# Patient Record
Sex: Male | Born: 1935 | Race: White | Hispanic: No | Marital: Married | Smoking: Former smoker
Health system: Southern US, Community
[De-identification: ages and names within clinical notes are randomized; demographics above are authoritative.]

## PROBLEM LIST (undated history)

## (undated) DIAGNOSIS — F32A Depression, unspecified: Secondary | ICD-10-CM

## (undated) DIAGNOSIS — F329 Major depressive disorder, single episode, unspecified: Secondary | ICD-10-CM

## (undated) DIAGNOSIS — I10 Essential (primary) hypertension: Secondary | ICD-10-CM

## (undated) DIAGNOSIS — J449 Chronic obstructive pulmonary disease, unspecified: Secondary | ICD-10-CM

## (undated) DIAGNOSIS — M1990 Unspecified osteoarthritis, unspecified site: Secondary | ICD-10-CM

## (undated) DIAGNOSIS — Z8679 Personal history of other diseases of the circulatory system: Secondary | ICD-10-CM

## (undated) DIAGNOSIS — Z8739 Personal history of other diseases of the musculoskeletal system and connective tissue: Secondary | ICD-10-CM

## (undated) DIAGNOSIS — G8929 Other chronic pain: Secondary | ICD-10-CM

## (undated) DIAGNOSIS — N400 Enlarged prostate without lower urinary tract symptoms: Secondary | ICD-10-CM

## (undated) DIAGNOSIS — M549 Dorsalgia, unspecified: Secondary | ICD-10-CM

## (undated) DIAGNOSIS — H3530 Unspecified macular degeneration: Secondary | ICD-10-CM

## (undated) DIAGNOSIS — Z87898 Personal history of other specified conditions: Secondary | ICD-10-CM

---

## 2011-06-03 DIAGNOSIS — M549 Dorsalgia, unspecified: Secondary | ICD-10-CM | POA: Insufficient documentation

## 2012-09-15 DIAGNOSIS — Z9109 Other allergy status, other than to drugs and biological substances: Secondary | ICD-10-CM | POA: Insufficient documentation

## 2012-11-05 DIAGNOSIS — H353290 Exudative age-related macular degeneration, unspecified eye, stage unspecified: Secondary | ICD-10-CM | POA: Insufficient documentation

## 2012-11-05 DIAGNOSIS — H35059 Retinal neovascularization, unspecified, unspecified eye: Secondary | ICD-10-CM | POA: Insufficient documentation

## 2012-11-20 DIAGNOSIS — M47812 Spondylosis without myelopathy or radiculopathy, cervical region: Secondary | ICD-10-CM | POA: Insufficient documentation

## 2012-11-20 DIAGNOSIS — R269 Unspecified abnormalities of gait and mobility: Secondary | ICD-10-CM

## 2012-11-20 DIAGNOSIS — J309 Allergic rhinitis, unspecified: Secondary | ICD-10-CM | POA: Insufficient documentation

## 2012-11-20 DIAGNOSIS — F329 Major depressive disorder, single episode, unspecified: Secondary | ICD-10-CM | POA: Insufficient documentation

## 2012-11-20 DIAGNOSIS — J438 Other emphysema: Secondary | ICD-10-CM | POA: Insufficient documentation

## 2012-11-20 DIAGNOSIS — N400 Enlarged prostate without lower urinary tract symptoms: Secondary | ICD-10-CM | POA: Insufficient documentation

## 2012-11-20 DIAGNOSIS — R079 Chest pain, unspecified: Secondary | ICD-10-CM | POA: Insufficient documentation

## 2012-11-20 DIAGNOSIS — R109 Unspecified abdominal pain: Secondary | ICD-10-CM | POA: Insufficient documentation

## 2012-11-22 DIAGNOSIS — N62 Hypertrophy of breast: Secondary | ICD-10-CM | POA: Insufficient documentation

## 2012-11-22 DIAGNOSIS — M109 Gout, unspecified: Secondary | ICD-10-CM | POA: Diagnosis present

## 2012-11-22 DIAGNOSIS — E663 Overweight: Secondary | ICD-10-CM | POA: Insufficient documentation

## 2013-05-10 DIAGNOSIS — M47817 Spondylosis without myelopathy or radiculopathy, lumbosacral region: Secondary | ICD-10-CM | POA: Insufficient documentation

## 2013-05-10 DIAGNOSIS — I10 Essential (primary) hypertension: Secondary | ICD-10-CM | POA: Diagnosis present

## 2013-05-10 DIAGNOSIS — H6120 Impacted cerumen, unspecified ear: Secondary | ICD-10-CM | POA: Insufficient documentation

## 2013-05-10 DIAGNOSIS — H9190 Unspecified hearing loss, unspecified ear: Secondary | ICD-10-CM | POA: Insufficient documentation

## 2013-05-10 DIAGNOSIS — H3530 Unspecified macular degeneration: Secondary | ICD-10-CM | POA: Insufficient documentation

## 2013-10-12 DIAGNOSIS — G609 Hereditary and idiopathic neuropathy, unspecified: Secondary | ICD-10-CM | POA: Insufficient documentation

## 2013-10-12 DIAGNOSIS — M7550 Bursitis of unspecified shoulder: Secondary | ICD-10-CM | POA: Insufficient documentation

## 2014-05-04 DIAGNOSIS — I619 Nontraumatic intracerebral hemorrhage, unspecified: Secondary | ICD-10-CM | POA: Insufficient documentation

## 2014-05-05 DIAGNOSIS — I491 Atrial premature depolarization: Secondary | ICD-10-CM | POA: Insufficient documentation

## 2014-05-05 DIAGNOSIS — R55 Syncope and collapse: Secondary | ICD-10-CM | POA: Insufficient documentation

## 2014-06-08 DIAGNOSIS — M25511 Pain in right shoulder: Secondary | ICD-10-CM | POA: Insufficient documentation

## 2018-02-27 ENCOUNTER — Emergency Department (HOSPITAL_COMMUNITY): Payer: Medicare Other

## 2018-02-27 ENCOUNTER — Inpatient Hospital Stay (HOSPITAL_COMMUNITY)
Admission: EM | Admit: 2018-02-27 | Discharge: 2018-03-05 | DRG: 055 | Disposition: A | Discharge status: Skilled Nursing Facility | Payer: Medicare Other | Attending: Internal Medicine | Admitting: Internal Medicine

## 2018-02-27 ENCOUNTER — Encounter (HOSPITAL_COMMUNITY): Payer: Self-pay | Admitting: Nurse Practitioner

## 2018-02-27 DIAGNOSIS — W19XXXA Unspecified fall, initial encounter: Secondary | ICD-10-CM | POA: Diagnosis not present

## 2018-02-27 DIAGNOSIS — Z7951 Long term (current) use of inhaled steroids: Secondary | ICD-10-CM | POA: Diagnosis not present

## 2018-02-27 DIAGNOSIS — H353290 Exudative age-related macular degeneration, unspecified eye, stage unspecified: Secondary | ICD-10-CM | POA: Diagnosis present

## 2018-02-27 DIAGNOSIS — M109 Gout, unspecified: Secondary | ICD-10-CM | POA: Diagnosis not present

## 2018-02-27 DIAGNOSIS — R011 Cardiac murmur, unspecified: Secondary | ICD-10-CM | POA: Diagnosis present

## 2018-02-27 DIAGNOSIS — Z515 Encounter for palliative care: Secondary | ICD-10-CM | POA: Diagnosis not present

## 2018-02-27 DIAGNOSIS — R269 Unspecified abnormalities of gait and mobility: Secondary | ICD-10-CM

## 2018-02-27 DIAGNOSIS — R911 Solitary pulmonary nodule: Secondary | ICD-10-CM | POA: Diagnosis not present

## 2018-02-27 DIAGNOSIS — R58 Hemorrhage, not elsewhere classified: Secondary | ICD-10-CM | POA: Diagnosis present

## 2018-02-27 DIAGNOSIS — Z79899 Other long term (current) drug therapy: Secondary | ICD-10-CM | POA: Diagnosis not present

## 2018-02-27 DIAGNOSIS — N390 Urinary tract infection, site not specified: Secondary | ICD-10-CM

## 2018-02-27 DIAGNOSIS — C801 Malignant (primary) neoplasm, unspecified: Secondary | ICD-10-CM

## 2018-02-27 DIAGNOSIS — I491 Atrial premature depolarization: Secondary | ICD-10-CM | POA: Diagnosis present

## 2018-02-27 DIAGNOSIS — Z882 Allergy status to sulfonamides status: Secondary | ICD-10-CM | POA: Diagnosis not present

## 2018-02-27 DIAGNOSIS — L989 Disorder of the skin and subcutaneous tissue, unspecified: Secondary | ICD-10-CM | POA: Diagnosis present

## 2018-02-27 DIAGNOSIS — G9389 Other specified disorders of brain: Secondary | ICD-10-CM

## 2018-02-27 DIAGNOSIS — H9113 Presbycusis, bilateral: Secondary | ICD-10-CM | POA: Diagnosis present

## 2018-02-27 DIAGNOSIS — Y92009 Unspecified place in unspecified non-institutional (private) residence as the place of occurrence of the external cause: Secondary | ICD-10-CM | POA: Diagnosis not present

## 2018-02-27 DIAGNOSIS — Z888 Allergy status to other drugs, medicaments and biological substances status: Secondary | ICD-10-CM

## 2018-02-27 DIAGNOSIS — Z66 Do not resuscitate: Secondary | ICD-10-CM | POA: Diagnosis present

## 2018-02-27 DIAGNOSIS — F418 Other specified anxiety disorders: Secondary | ICD-10-CM | POA: Diagnosis present

## 2018-02-27 DIAGNOSIS — C7931 Secondary malignant neoplasm of brain: Principal | ICD-10-CM | POA: Diagnosis present

## 2018-02-27 DIAGNOSIS — I10 Essential (primary) hypertension: Secondary | ICD-10-CM | POA: Diagnosis present

## 2018-02-27 DIAGNOSIS — R296 Repeated falls: Secondary | ICD-10-CM | POA: Diagnosis present

## 2018-02-27 DIAGNOSIS — R260 Ataxic gait: Secondary | ICD-10-CM | POA: Diagnosis present

## 2018-02-27 DIAGNOSIS — Z87891 Personal history of nicotine dependence: Secondary | ICD-10-CM | POA: Diagnosis not present

## 2018-02-27 DIAGNOSIS — C787 Secondary malignant neoplasm of liver and intrahepatic bile duct: Secondary | ICD-10-CM | POA: Diagnosis present

## 2018-02-27 DIAGNOSIS — C384 Malignant neoplasm of pleura: Secondary | ICD-10-CM | POA: Diagnosis present

## 2018-02-27 DIAGNOSIS — R531 Weakness: Secondary | ICD-10-CM | POA: Diagnosis not present

## 2018-02-27 DIAGNOSIS — J449 Chronic obstructive pulmonary disease, unspecified: Secondary | ICD-10-CM | POA: Diagnosis present

## 2018-02-27 DIAGNOSIS — G939 Disorder of brain, unspecified: Secondary | ICD-10-CM | POA: Diagnosis not present

## 2018-02-27 DIAGNOSIS — R270 Ataxia, unspecified: Secondary | ICD-10-CM | POA: Diagnosis not present

## 2018-02-27 DIAGNOSIS — K219 Gastro-esophageal reflux disease without esophagitis: Secondary | ICD-10-CM | POA: Diagnosis present

## 2018-02-27 DIAGNOSIS — R918 Other nonspecific abnormal finding of lung field: Secondary | ICD-10-CM | POA: Diagnosis not present

## 2018-02-27 DIAGNOSIS — Z9181 History of falling: Secondary | ICD-10-CM | POA: Diagnosis not present

## 2018-02-27 DIAGNOSIS — Z8679 Personal history of other diseases of the circulatory system: Secondary | ICD-10-CM

## 2018-02-27 HISTORY — DX: Other chronic pain: G89.29

## 2018-02-27 HISTORY — DX: Personal history of other diseases of the musculoskeletal system and connective tissue: Z87.39

## 2018-02-27 HISTORY — DX: Dorsalgia, unspecified: M54.9

## 2018-02-27 HISTORY — DX: Chronic obstructive pulmonary disease, unspecified: J44.9

## 2018-02-27 HISTORY — DX: Personal history of other diseases of the circulatory system: Z86.79

## 2018-02-27 HISTORY — DX: Unspecified macular degeneration: H35.30

## 2018-02-27 HISTORY — DX: Unspecified osteoarthritis, unspecified site: M19.90

## 2018-02-27 HISTORY — DX: Depression, unspecified: F32.A

## 2018-02-27 HISTORY — DX: Major depressive disorder, single episode, unspecified: F32.9

## 2018-02-27 HISTORY — DX: Essential (primary) hypertension: I10

## 2018-02-27 HISTORY — DX: Personal history of other specified conditions: Z87.898

## 2018-02-27 HISTORY — DX: Benign prostatic hyperplasia without lower urinary tract symptoms: N40.0

## 2018-02-27 LAB — COMPREHENSIVE METABOLIC PANEL
ALBUMIN: 3.8 g/dL (ref 3.5–5.0)
ALK PHOS: 61 U/L (ref 38–126)
ALT: 15 U/L (ref 0–44)
AST: 23 U/L (ref 15–41)
Anion gap: 12 (ref 5–15)
BILIRUBIN TOTAL: 0.9 mg/dL (ref 0.3–1.2)
BUN: 22 mg/dL (ref 8–23)
CALCIUM: 9.2 mg/dL (ref 8.9–10.3)
CO2: 28 mmol/L (ref 22–32)
Chloride: 101 mmol/L (ref 98–111)
Creatinine, Ser: 1.12 mg/dL (ref 0.61–1.24)
GFR calc Af Amer: >60 mL/min (ref >60)
GFR calc non Af Amer: 55 mL/min — ABNORMAL LOW (ref >60)
GLUCOSE: 113 mg/dL — AB (ref 70–99)
POTASSIUM: 3.9 mmol/L (ref 3.5–5.1)
Sodium: 141 mmol/L (ref 135–145)
TOTAL PROTEIN: 7 g/dL (ref 6.5–8.1)

## 2018-02-27 LAB — URINALYSIS, ROUTINE W REFLEX MICROSCOPIC
Bilirubin Urine: NEGATIVE
Glucose, UA: NEGATIVE mg/dL
Hgb urine dipstick: NEGATIVE
Ketones, ur: NEGATIVE mg/dL
Nitrite: NEGATIVE
PH: 6 (ref 5.0–8.0)
Protein, ur: NEGATIVE mg/dL
SPECIFIC GRAVITY, URINE: 1.014 (ref 1.005–1.030)

## 2018-02-27 LAB — CBC
HEMATOCRIT: 41.9 % (ref 39.0–52.0)
Hemoglobin: 13.8 g/dL (ref 13.0–17.0)
MCH: 30.9 pg (ref 26.0–34.0)
MCHC: 32.9 g/dL (ref 30.0–36.0)
MCV: 93.9 fL (ref 78.0–100.0)
Platelets: 238 10*3/uL (ref 150–400)
RBC: 4.46 MIL/uL (ref 4.22–5.81)
RDW: 13.1 % (ref 11.5–15.5)
WBC: 6.9 10*3/uL (ref 4.0–10.5)

## 2018-02-27 LAB — I-STAT CG4 LACTIC ACID, ED: Lactic Acid, Venous: 1.63 mmol/L (ref 0.5–1.9)

## 2018-02-27 MED ORDER — SODIUM CHLORIDE 0.9 % IV SOLN
1.0000 g | Freq: Once | INTRAVENOUS | Status: AC
  Administered 2018-02-27: 1 g via INTRAVENOUS
  Filled 2018-02-27: qty 10

## 2018-02-27 MED ORDER — SERTRALINE HCL 50 MG PO TABS
50.0000 mg | ORAL_TABLET | Freq: Every day | ORAL | Status: DC
  Administered 2018-02-28 – 2018-03-05 (×5): 50 mg via ORAL
  Filled 2018-02-27 (×5): qty 1

## 2018-02-27 MED ORDER — ACETAMINOPHEN 325 MG PO TABS
650.0000 mg | ORAL_TABLET | Freq: Once | ORAL | Status: DC
  Filled 2018-02-27: qty 2

## 2018-02-27 MED ORDER — SODIUM CHLORIDE 0.9 % IV SOLN: 250.0000 mL | INTRAVENOUS | Status: DC | PRN

## 2018-02-27 MED ORDER — PANTOPRAZOLE SODIUM 40 MG PO TBEC
40.0000 mg | DELAYED_RELEASE_TABLET | Freq: Every day | ORAL | Status: DC
  Administered 2018-02-28 – 2018-03-05 (×5): 40 mg via ORAL
  Filled 2018-02-27 (×4): qty 1

## 2018-02-27 MED ORDER — DEXAMETHASONE SODIUM PHOSPHATE 10 MG/ML IJ SOLN
10.0000 mg | Freq: Once | INTRAMUSCULAR | Status: AC
  Administered 2018-02-27: 10 mg via INTRAVENOUS
  Filled 2018-02-27: qty 1

## 2018-02-27 MED ORDER — DEXAMETHASONE SODIUM PHOSPHATE 10 MG/ML IJ SOLN
8.0000 mg | Freq: Two times a day (BID) | INTRAMUSCULAR | Status: DC
  Administered 2018-02-28: 0.8 mg via INTRAVENOUS
  Administered 2018-02-28 – 2018-03-05 (×10): 8 mg via INTRAVENOUS
  Filled 2018-02-27 (×11): qty 1

## 2018-02-27 MED ORDER — SODIUM CHLORIDE 0.9% FLUSH: 3.0000 mL | INTRAVENOUS | Status: DC | PRN

## 2018-02-27 MED ORDER — FUROSEMIDE 40 MG PO TABS
40.0000 mg | ORAL_TABLET | Freq: Every day | ORAL | Status: DC
  Administered 2018-02-28 – 2018-03-05 (×5): 40 mg via ORAL
  Filled 2018-02-27 (×5): qty 1

## 2018-02-27 MED ORDER — HEPARIN SODIUM (PORCINE) 5000 UNIT/ML IJ SOLN
5000.0000 [IU] | Freq: Three times a day (TID) | INTRAMUSCULAR | Status: DC
  Administered 2018-02-27 – 2018-03-04 (×8): 5000 [IU] via SUBCUTANEOUS
  Filled 2018-02-27 (×8): qty 1

## 2018-02-27 MED ORDER — MORPHINE SULFATE (PF) 2 MG/ML IV SOLN
2.0000 mg | Freq: Once | INTRAVENOUS | Status: AC
  Administered 2018-02-27: 2 mg via INTRAVENOUS
  Filled 2018-02-27: qty 1

## 2018-02-27 MED ORDER — SODIUM CHLORIDE 0.9% FLUSH
3.0000 mL | Freq: Two times a day (BID) | INTRAVENOUS | Status: DC
  Administered 2018-02-28 – 2018-03-01 (×4): 3 mL via INTRAVENOUS

## 2018-02-27 NOTE — ED Notes (Signed)
ED TO INPATIENT HANDOFF REPORT  Name/Age/Gender Brendan Rose 84 y.o. male  Code Status   Home/SNF/Other Nursing Home  Chief Complaint Fall; syncope  Level of Care/Admitting Diagnosis ED Disposition    ED Disposition Condition [place]: [facility] [[id number]]  Level of Care: Med-Surg [16]  Diagnosis: Brain mass [[id number]]  Admitting Physician: Eston Esters  Attending Physician: Gwynne Edinger [[id number]]  Estimated length of stay: past midnight tomorrow  Certification:: I certify this patient will need inpatient services for at least 2 midnights  PT Class (Do Not Modify): Inpatient [101]  PT Acc Code (Do Not Modify): Private [1]       Medical History History reviewed. No pertinent past medical history.  Allergies Allergies  Allergen Reactions  . Allopurinol Other (See Comments)  . Sulfa Antibiotics Rash    IV Location/Drains/Wounds Patient Lines/Drains/Airways Status   Active Line/Drains/Airways    Name:   Placement date:   Placement time:   Site:   Days:   Peripheral IV 02/27/18 Left;Medial Antecubital   02/27/18    1949    Antecubital   less than 1          Labs/Imaging Results for orders placed or performed during the hospital encounter of 02/27/18 (from the past 48 hour(s))  Urinalysis, Routine w reflex microscopic     Status: Abnormal   Collection Time: 02/27/18  7:25 PM  Result Value Ref Range   Color, Urine YELLOW YELLOW   APPearance HAZY (A) CLEAR   Specific Gravity, Urine 1.014 1.005 - 1.030   pH 6.0 5.0 - 8.0   Glucose, UA NEGATIVE NEGATIVE mg/dL   Hgb urine dipstick NEGATIVE NEGATIVE   Bilirubin Urine NEGATIVE NEGATIVE   Ketones, ur NEGATIVE NEGATIVE mg/dL   Protein, ur NEGATIVE NEGATIVE mg/dL   Nitrite NEGATIVE NEGATIVE   Leukocytes, UA SMALL (A) NEGATIVE   RBC / HPF 0-5 0 - 5 RBC/hpf   WBC, UA 21-50 0 - 5 WBC/hpf   Bacteria, UA RARE (A) NONE SEEN   Mucus PRESENT     Comment:  Performed at [facility], [place] [address], [place], [place] [address]  CBC     Status: None   Collection Time: 02/27/18  7:50 PM  Result Value Ref Range   WBC 6.9 4.0 - 10.5 K/uL   RBC 4.46 4.22 - 5.81 MIL/uL   Hemoglobin 13.8 13.0 - 17.0 g/dL   HCT 41.9 39.0 - 52.0 %   MCV 93.9 78.0 - 100.0 fL   MCH 30.9 26.0 - 34.0 pg   MCHC 32.9 30.0 - 36.0 g/dL   RDW 13.1 11.5 - 15.5 %   Platelets 238 150 - 400 K/uL    Comment: Performed at [facility], [place] [address], [place], [place] [address]  Comprehensive metabolic panel     Status: Abnormal   Collection Time: 02/27/18  7:50 PM  Result Value Ref Range   Sodium 141 135 - 145 mmol/L   Potassium 3.9 3.5 - 5.1 mmol/L   Chloride 101 98 - 111 mmol/L   CO2 28 22 - 32 mmol/L   Glucose, Bld 113 (H) 70 - 99 mg/dL   BUN 22 8 - 23 mg/dL   Creatinine, Ser 1.12 0.61 - 1.24 mg/dL   Calcium 9.2 8.9 - 10.3 mg/dL   Total Protein 7.0 6.5 - 8.1 g/dL   Albumin 3.8 3.5 - 5.0 g/dL   AST 23 15 -  41 U/L   ALT 15 0 - 44 U/L   Alkaline Phosphatase 61 38 - 126 U/L   Total Bilirubin 0.9 0.3 - 1.2 mg/dL   GFR calc non Af Amer 55 (L) >60 mL/min   GFR calc Af Amer >60 >60 mL/min    Comment: (NOTE) The eGFR has been calculated using the CKD EPI equation. This calculation has not been validated in all clinical situations. eGFR's persistently <60 mL/min signify possible Chronic Kidney Disease.    Anion gap 12 5 - 15    Comment: Performed at [facility], [place] [address], [place], [place] [address]  I-Stat CG4 Lactic Acid, ED     Status: None   Collection Time: 02/27/18  8:22 PM  Result Value Ref Range   Lactic Acid, Venous 1.63 0.5 - 1.9 mmol/L   Dg Chest 2 View  Result Date: 02/27/2018 CLINICAL DATA:  Pain after fall. EXAM: CHEST - 2 VIEW COMPARISON:  Radiograph 12/25/2017, CT 12/31/2017 FINDINGS: Again seen elevation of right hemidiaphragm. Masslike opacity in the periphery of the right lower lobe is  grossly similar less well-defined currently. No pneumothorax. No new focal airspace disease, pleural effusion or pulmonary edema. Mild peripheral scarring in the right midlung. No acute osseous abnormalities are seen. Chronic change about the right acromioclavicular joint. IMPRESSION: 1. No acute or traumatic findings. 2. Unchanged elevation of right hemidiaphragm. Persistent rounded opacity in the periphery of the right lower lobe, masslike opacity on prior chest CT. Given persistence, neoplastic process is favored. Follow-up chest CT is already scheduled. Electronically Signed   By: Keith Rake M.D.   On: 02/27/2018 21:03   Dg Lumbar Spine Complete  Result Date: 02/27/2018 CLINICAL DATA:  Initial encounter for Pt is presented from [facility] for evaluation post fall suspected to be related to a near syncope episode per family report. EXAM: LUMBAR SPINE - COMPLETE 4+ VIEW COMPARISON:  None. FINDINGS: Five lumbar type vertebral bodies. Left hip arthroplasty. Sacroiliac joints are symmetric. Maintenance of vertebral body height and alignment. Spondylosis involves L3 through S1 with degenerative disc disease and facet arthropathy. Aortic atherosclerosis. IMPRESSION: Spondylosis, without acute osseous abnormality. Aortic Atherosclerosis (ICD10-I70.0). Electronically Signed   By: Abigail Miyamoto M.D.   On: 02/27/2018 21:00   Ct Head Wo Contrast  Result Date: 02/27/2018 CLINICAL DATA:  Post fall with near syncopal episode. EXAM: CT HEAD WITHOUT CONTRAST TECHNIQUE: Contiguous axial images were obtained from the base of the skull through the vertex without intravenous contrast. COMPARISON:  11/19/2016 FINDINGS: Brain: Lateral and third ventricles are within normal. There is mild impression on the fourth ventricle as there are multiple cerebellar masses with the largest measuring 4.3 cm over the left hemisphere. These masses have low-density center and likely due to metastatic disease. There is mild adjacent  vasogenic edema. No definite supratentorial masses identified. No evidence of acute hemorrhage or midline shift. No acute infarction. Mild chronic ischemic microvascular disease. Vascular: No hyperdense vessel or unexpected calcification. Skull: Normal. Negative for fracture or focal lesion. Sinuses/Orbits: No acute finding. Other: None. IMPRESSION: No acute findings. Multiple cerebellar masses with mild adjacent edema and mild mass effect on the fourth ventricle. 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 Mild chronic ischemic microvascular disease. Electronically Signed   By: Marin Olp M.D.   On: 02/27/2018 20:06    Pending Labs Unresulted Labs (From admission, onward)    Start     Ordered   02/27/18 2008  Urine Culture  Once,   STAT  02/27/18 2007   Signed and Held  CBC  (heparin)  Once,   R    Comments:  Baseline for heparin therapy IF NOT ALREADY DRAWN.  Notify MD if PLT < 100 K.    Signed and Held   Signed and Held  Creatinine, serum  (heparin)  Once,   R    Comments:  Baseline for heparin therapy IF NOT ALREADY DRAWN.    Signed and Held          Vitals/Pain Today's Vitals   02/27/18 1930 02/27/18 1945 02/27/18 2055 02/27/18 2220  BP:  (!) 163/112  (!) 128/59  Pulse:  (!) 49  (!) 57  Resp: 17 (!) 21  18  Temp:      TempSrc:      SpO2:  96%  93%  PainSc:   Asleep     Isolation Precautions No active isolations  Medications Medications  acetaminophen (TYLENOL) tablet 650 mg (650 mg Oral Not Given 02/27/18 2015)  morphine 2 MG/ML injection 2 mg (2 mg Intravenous Given 02/27/18 2015)  dexamethasone (DECADRON) injection 10 mg (10 mg Intravenous Given 02/27/18 2055)  cefTRIAXone (ROCEPHIN) 1 g in sodium chloride 0.9 % 100 mL IVPB (0 g Intravenous Stopped 02/27/18 2228)    Mobility non-ambulatory

## 2018-02-27 NOTE — ED Notes (Signed)
This RN spoke to [employer] (pt daughter) on the phone and addressed her concerns about preferring that he go to [facility]. I explained that we are able to access his charts here and are happy to take good care of him. I also explained that due to EMTALA, we are unable to transfer him over there. She verballized understanding and stated that she was really just frustrated with EMS. I told her that if she has any other questions, she is welcome to call back. No concerns at this time.

## 2018-02-27 NOTE — H&P (Addendum)
History and Physical    Brendan Rose:[id number] DOB: 25-Mar-1922 DOA: 02/27/2018  PCP: Cathlean Sauer, MD  Patient coming from: assisted living facility [facility]   Chief Complaint: fall at home  HPI: Brendan Rose is a 84 y.o. male with medical history significant for htn, gout, presbycusis, presents w/ above.  Patient reports that for about a week has been unsteady on his feet and has had several falls. Shortly before arrival tonight went to bathroom and when stood up fell, family reports brief LOC, was conscious when went to him. Unwitnessed. Denies head pain or pain elsewhere. Currently feeling his normal self. No trouble speaking or swallowing. No focal weakness/numbness. No dysuria or fevers.  ED Course: ct, labs, dexamethasone  Review of Systems: As per HPI otherwise 10 point review of systems negative.    History reviewed. No pertinent past medical history.  History reviewed. No pertinent surgical history.   reports that he drank alcohol. He reports that he has current or past drug history. His tobacco history is not on file.  Allergies  Allergen Reactions  . Allopurinol Other (See Comments)  . Sulfa Antibiotics Rash    History reviewed. No pertinent family history.  Prior to Admission medications   Medication Sig Start Date End Date Taking? Authorizing Provider  SERTRALINE HCL PO Take by mouth.    [provider]    Physical Exam: Vitals:   02/27/18 1900 02/27/18 1915 02/27/18 1930 02/27/18 1945  BP:  (!) 146/49  (!) 163/112  Pulse: (!) 35 (!) 115  (!) 49  Resp: (!) 21 17 17  (!) 21  Temp:      TempSrc:      SpO2: 98% (!) 78%  96%    Constitutional: No acute distress Head: Atraumatic Eyes: Conjunctiva clear ENM: Moist mucous membranes. Poor dentition.  Neck: Supple Respiratory: Clear to auscultation bilaterally, no wheezing/rales/rhonchi. Normal respiratory effort. No accessory muscle use. . Cardiovascular: Regular rate and  rhythm. Soft systolic murmur, no rubs/gallops. Abdomen: Non-tender, non-distended. No masses. No rebound or guarding. Positive bowel sounds. Musculoskeletal: No joint deformity upper and lower extremities. Normal ROM, no contractures. Normal muscle tone.  Skin: echmyoses and various skin lesions throughout Extremities: No peripheral edema. Palpable peripheral pulses. Neurologic: Alert, moving all 4 extremities. Hard of hearing. Per ED physician, unable to ambulate safely Psychiatric: Normal insight and judgement.    Labs on Admission: I have personally reviewed following labs and imaging studies  CBC: Recent Labs  Lab 02/27/18 1950  WBC 6.9  HGB 13.8  HCT 41.9  MCV 93.9  PLT 536   Basic Metabolic Panel: Recent Labs  Lab 02/27/18 1950  NA 141  K 3.9  CL 101  CO2 28  GLUCOSE 113*  BUN 22  CREATININE 1.12  CALCIUM 9.2   GFR: CrCl cannot be calculated (Unknown ideal weight.). Liver Function Tests: Recent Labs  Lab 02/27/18 1950  AST 23  ALT 15  ALKPHOS 61  BILITOT 0.9  PROT 7.0  ALBUMIN 3.8   No results for input(s): LIPASE, AMYLASE in the last 168 hours. No results for input(s): AMMONIA in the last 168 hours. Coagulation Profile: No results for input(s): INR, PROTIME in the last 168 hours. Cardiac Enzymes: No results for input(s): CKTOTAL, CKMB, CKMBINDEX, TROPONINI in the last 168 hours. BNP (last 3 results) No results for input(s): PROBNP in the last 8760 hours. HbA1C: No results for input(s): HGBA1C in the last 72 hours. CBG: No results for input(s): GLUCAP in the  last 168 hours. Lipid Profile: No results for input(s): CHOL, HDL, LDLCALC, TRIG, CHOLHDL, LDLDIRECT in the last 72 hours. Thyroid Function Tests: No results for input(s): TSH, T4TOTAL, FREET4, T3FREE, THYROIDAB in the last 72 hours. Anemia Panel: No results for input(s): VITAMINB12, FOLATE, FERRITIN, TIBC, IRON, RETICCTPCT in the last 72 hours. Urine analysis:    Component Value Date/Time     COLORURINE YELLOW 02/27/2018 1925   APPEARANCEUR HAZY (A) 02/27/2018 1925   LABSPEC 1.014 02/27/2018 1925   PHURINE 6.0 02/27/2018 1925   GLUCOSEU NEGATIVE 02/27/2018 1925   HGBUR NEGATIVE 02/27/2018 1925   BILIRUBINUR NEGATIVE 02/27/2018 1925   KETONESUR NEGATIVE 02/27/2018 1925   PROTEINUR NEGATIVE 02/27/2018 1925   NITRITE NEGATIVE 02/27/2018 1925   LEUKOCYTESUR SMALL (A) 02/27/2018 1925    Radiological Exams on Admission: Dg Chest 2 View  Result Date: 02/27/2018 CLINICAL DATA:  Pain after fall. EXAM: CHEST - 2 VIEW COMPARISON:  Radiograph 12/25/2017, CT 12/31/2017 FINDINGS: Again seen elevation of right hemidiaphragm. Masslike opacity in the periphery of the right lower lobe is grossly similar less well-defined currently. No pneumothorax. No new focal airspace disease, pleural effusion or pulmonary edema. Mild peripheral scarring in the right midlung. No acute osseous abnormalities are seen. Chronic change about the right acromioclavicular joint. IMPRESSION: 1. No acute or traumatic findings. 2. Unchanged elevation of right hemidiaphragm. Persistent rounded opacity in the periphery of the right lower lobe, masslike opacity on prior chest CT. Given persistence, neoplastic process is favored. Follow-up chest CT is already scheduled. Electronically Signed   By: Keith Rake M.D.   On: 02/27/2018 21:03   Dg Lumbar Spine Complete  Result Date: 02/27/2018 CLINICAL DATA:  Initial encounter for Pt is presented from [facility] for evaluation post fall suspected to be related to a near syncope episode per family report. EXAM: LUMBAR SPINE - COMPLETE 4+ VIEW COMPARISON:  None. FINDINGS: Five lumbar type vertebral bodies. Left hip arthroplasty. Sacroiliac joints are symmetric. Maintenance of vertebral body height and alignment. Spondylosis involves L3 through S1 with degenerative disc disease and facet arthropathy. Aortic atherosclerosis. IMPRESSION: Spondylosis, without acute osseous  abnormality. Aortic Atherosclerosis (ICD10-I70.0). Electronically Signed   By: Abigail Miyamoto M.D.   On: 02/27/2018 21:00   Ct Head Wo Contrast  Result Date: 02/27/2018 CLINICAL DATA:  Post fall with near syncopal episode. EXAM: CT HEAD WITHOUT CONTRAST TECHNIQUE: Contiguous axial images were obtained from the base of the skull through the vertex without intravenous contrast. COMPARISON:  11/19/2016 FINDINGS: Brain: Lateral and third ventricles are within normal. There is mild impression on the fourth ventricle as there are multiple cerebellar masses with the largest measuring 4.3 cm over the left hemisphere. These masses have low-density center and likely due to metastatic disease. There is mild adjacent vasogenic edema. No definite supratentorial masses identified. No evidence of acute hemorrhage or midline shift. No acute infarction. Mild chronic ischemic microvascular disease. Vascular: No hyperdense vessel or unexpected calcification. Skull: Normal. Negative for fracture or focal lesion. Sinuses/Orbits: No acute finding. Other: None. IMPRESSION: No acute findings. Multiple cerebellar masses with mild adjacent edema and mild mass effect on the fourth ventricle. 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 Mild chronic ischemic microvascular disease. Electronically Signed   By: Marin Olp M.D.   On: 02/27/2018 20:06    EKG: Independently reviewed. 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 CXR with a RLL opacity possibly also cancerous. No head or other trauma. ua mildly suggestive but no clear symptoms of UTI. Significant gait abnormality, unsafe for discharge at this time. Received dexamethasone 10 mg IV in ED to treat presumed  vasogenic edema - cont dexamethasone - oncology consult am - up w/ assistance, seizure precautions  # HTN # LE edema - here bp mod elevated. No sig edema - cont home lasix 40   # mdd - cont home sertraline  # gerd - pantop  DVT prophylaxis: heparin, scds Code Status: dnr, confirmed w/ family Family Communication: son michael  Disposition Plan: tbd  Consults called: none  Admission status: med/surg    Desma Maxim MD [facility] Pager [phone]  If 7PM-7AM, please contact night-coverage [email or web address] Password [facility]  02/27/2018, 10:12 PM

## 2018-02-27 NOTE — ED Provider Notes (Signed)
[facility] DEPT Provider Note   CSN: [id number] Arrival date & time: 02/27/18  1756     History   Chief Complaint Chief Complaint  Patient presents with  . Fall  . Near Syncope    HPI YOVANY CLOCK is a 84 y.o. male.  Patient s/p fall at [facility]. Pt had just used bathroom/urinated - stood up, and had fall, briefly felt faint. Those nearby immediately went to patient and found him conscious and alert. Pt since has been up on feet. At baseline, walks w walker. Denies faintness or dizziness currently. Denies injury. No headache or nv. No neck or back pain. No chest pain or discomfort. No sob. No extremity pain or injury. No anticoag use. Family arrives - additional history provided - recent imbalance with frequent falls.   The history is provided by the patient and the EMS personnel.    History reviewed. No pertinent past medical history.  Patient Active Problem List   Diagnosis Date Noted  . Right shoulder pain 06/08/2014  . PAC (premature atrial contraction) 05/05/2014  . Supraventricular premature beats 05/05/2014  . Syncope and collapse 05/05/2014  . 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 Subcoracoid bursitis 10/12/2013  . Hereditary and idiopathic peripheral neuropathy 10/12/2013  . Essential hypertension 05/10/2013  . Impacted cerumen 05/10/2013  . Lumbosacral spondylosis without myelopathy 05/10/2013  . Hearing loss 05/10/2013  . Macular degeneration (senile) of retina 05/10/2013  . Gout 11/22/2012  . Overweight 11/22/2012  . Hypertrophy of breast 11/22/2012  . Abdominal pain 11/20/2012  . Chest pain 11/20/2012  . Abnormality of gait 11/20/2012  . Allergic rhinitis 11/20/2012  . Benign enlargement of prostate 11/20/2012  . Cervical spondylosis without myelopathy 11/20/2012  . Other emphysema ([place]) 11/20/2012  . Major depressive disorder, single episode 11/20/2012  . Exudative age-related macular degeneration  ([place]) 11/05/2012  . Neovascularization, retina 11/05/2012  . Other allergy status, other than to drugs and biological substances 09/15/2012  . Backache 06/03/2011    History reviewed. No pertinent surgical history.      Home Medications    Prior to Admission medications   Medication Sig Start Date End Date Taking? Authorizing Provider  SERTRALINE HCL PO Take by mouth.    [provider]    Family History History reviewed. No pertinent family history.  Social History Social History   Tobacco Use  . Smoking status: Unknown If Ever Smoked  Substance Use Topics  . Alcohol use: Not Currently  . Drug use: Not Currently     Allergies   Allopurinol and Sulfa antibiotics   Review of Systems Review of Systems  Constitutional: Negative for fever.  HENT: Negative for sore throat.   Eyes: Negative for redness.  Respiratory: Negative for shortness of breath.   Cardiovascular: Negative for chest pain.  Gastrointestinal: Negative for abdominal pain and blood in stool.  Genitourinary: Negative for dysuria and flank pain.  Musculoskeletal: Negative for back pain and neck pain.  Skin: Negative for wound.  Neurological: Negative for headaches.  Hematological: Does not bruise/bleed easily.  Psychiatric/Behavioral: Negative for confusion.     Physical Exam Updated Vital Signs BP (!) 163/112   Pulse (!) 49   Temp 98.7 F (37.1 C) (Oral)   Resp (!) 21   SpO2 96%   Physical Exam  Constitutional: He is oriented to person, place, and time. He appears well-developed and well-nourished.  HENT:  Head: Atraumatic.  Mouth/Throat: Oropharynx is clear and moist.  Eyes: Pupils are equal,  round, and reactive to light. Conjunctivae are normal.  Neck: Normal range of motion. Neck supple. No tracheal deviation present.  Cardiovascular: Normal rate, regular rhythm, normal heart sounds and intact distal pulses.  Pulmonary/Chest: Effort normal and breath sounds normal. No  accessory muscle usage. No respiratory distress.  Abdominal: Soft. Bowel sounds are normal. He exhibits no distension and no mass. There is no tenderness. There is no guarding.  Genitourinary:  Genitourinary Comments: No cva tenderness  Musculoskeletal: He exhibits no edema or tenderness.  CTLS spine, non tender, aligned, no step off. Good rom bil extremities without pain or focal bony tenderness.   Neurological: He is alert and oriented to person, place, and time.  Speech clear/fluent. Motor/sens grossly intact.   Skin: Skin is warm and dry.  Psychiatric: He has a normal mood and affect.  Nursing note and vitals reviewed.    ED Treatments / Results  Labs (all labs ordered are listed, but only abnormal results are displayed) Results for orders placed or performed during the hospital encounter of 02/27/18  CBC  Result Value Ref Range   WBC 6.9 4.0 - 10.5 K/uL   RBC 4.46 4.22 - 5.81 MIL/uL   Hemoglobin 13.8 13.0 - 17.0 g/dL   HCT 41.9 39.0 - 52.0 %   MCV 93.9 78.0 - 100.0 fL   MCH 30.9 26.0 - 34.0 pg   MCHC 32.9 30.0 - 36.0 g/dL   RDW 13.1 11.5 - 15.5 %   Platelets 238 150 - 400 K/uL  Urinalysis, Routine w reflex microscopic  Result Value Ref Range   Color, Urine YELLOW YELLOW   APPearance HAZY (A) CLEAR   Specific Gravity, Urine 1.014 1.005 - 1.030   pH 6.0 5.0 - 8.0   Glucose, UA NEGATIVE NEGATIVE mg/dL   Hgb urine dipstick NEGATIVE NEGATIVE   Bilirubin Urine NEGATIVE NEGATIVE   Ketones, ur NEGATIVE NEGATIVE mg/dL   Protein, ur NEGATIVE NEGATIVE mg/dL   Nitrite NEGATIVE NEGATIVE   Leukocytes, UA SMALL (A) NEGATIVE   RBC / HPF 0-5 0 - 5 RBC/hpf   WBC, UA 21-50 0 - 5 WBC/hpf   Bacteria, UA RARE (A) NONE SEEN   Mucus PRESENT   Comprehensive metabolic panel  Result Value Ref Range   Sodium 141 135 - 145 mmol/L   Potassium 3.9 3.5 - 5.1 mmol/L   Chloride 101 98 - 111 mmol/L   CO2 28 22 - 32 mmol/L   Glucose, Bld 113 (H) 70 - 99 mg/dL   BUN 22 8 - 23 mg/dL    Creatinine, Ser 1.12 0.61 - 1.24 mg/dL   Calcium 9.2 8.9 - 10.3 mg/dL   Total Protein 7.0 6.5 - 8.1 g/dL   Albumin 3.8 3.5 - 5.0 g/dL   AST 23 15 - 41 U/L   ALT 15 0 - 44 U/L   Alkaline Phosphatase 61 38 - 126 U/L   Total Bilirubin 0.9 0.3 - 1.2 mg/dL   GFR calc non Af Amer 55 (L) >60 mL/min   GFR calc Af Amer >60 >60 mL/min   Anion gap 12 5 - 15  I-Stat CG4 Lactic Acid, ED  Result Value Ref Range   Lactic Acid, Venous 1.63 0.5 - 1.9 mmol/L   Dg Chest 2 View  Result Date: 02/27/2018 CLINICAL DATA:  Pain after fall. EXAM: CHEST - 2 VIEW COMPARISON:  Radiograph 12/25/2017, CT 12/31/2017 FINDINGS: Again seen elevation of right hemidiaphragm. Masslike opacity in the periphery of the right lower lobe is grossly similar  less well-defined currently. No pneumothorax. No new focal airspace disease, pleural effusion or pulmonary edema. Mild peripheral scarring in the right midlung. No acute osseous abnormalities are seen. Chronic change about the right acromioclavicular joint. IMPRESSION: 1. No acute or traumatic findings. 2. Unchanged elevation of right hemidiaphragm. Persistent rounded opacity in the periphery of the right lower lobe, masslike opacity on prior chest CT. Given persistence, neoplastic process is favored. Follow-up chest CT is already scheduled. Electronically Signed   By: Keith Rake M.D.   On: 02/27/2018 21:03   Dg Lumbar Spine Complete  Result Date: 02/27/2018 CLINICAL DATA:  Initial encounter for Pt is presented from [facility] for evaluation post fall suspected to be related to a near syncope episode per family report. EXAM: LUMBAR SPINE - COMPLETE 4+ VIEW COMPARISON:  None. FINDINGS: Five lumbar type vertebral bodies. Left hip arthroplasty. Sacroiliac joints are symmetric. Maintenance of vertebral body height and alignment. Spondylosis involves L3 through S1 with degenerative disc disease and facet arthropathy. Aortic atherosclerosis. IMPRESSION: Spondylosis, without acute  osseous abnormality. Aortic Atherosclerosis (ICD10-I70.0). Electronically Signed   By: Abigail Miyamoto M.D.   On: 02/27/2018 21:00   Ct Head Wo Contrast  Result Date: 02/27/2018 CLINICAL DATA:  Post fall with near syncopal episode. EXAM: CT HEAD WITHOUT CONTRAST TECHNIQUE: Contiguous axial images were obtained from the base of the skull through the vertex without intravenous contrast. COMPARISON:  11/19/2016 FINDINGS: Brain: Lateral and third ventricles are within normal. There is mild impression on the fourth ventricle as there are multiple cerebellar masses with the largest measuring 4.3 cm over the left hemisphere. These masses have low-density center and likely due to metastatic disease. There is mild adjacent vasogenic edema. No definite supratentorial masses identified. No evidence of acute hemorrhage or midline shift. No acute infarction. Mild chronic ischemic microvascular disease. Vascular: No hyperdense vessel or unexpected calcification. Skull: Normal. Negative for fracture or focal lesion. Sinuses/Orbits: No acute finding. Other: None. IMPRESSION: No acute findings. Multiple cerebellar masses with mild adjacent edema and mild mass effect on the fourth ventricle. 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 Mild chronic ischemic microvascular disease. Electronically Signed   By: Marin Olp M.D.   On: 02/27/2018 20:06    EKG EKG Interpretation  Date/Time:  Saturday February 27 2018 18:16:05 EDT Ventricular Rate:  63 PR Interval:    QRS Duration: 124 QT Interval:  407 QTC Calculation: 417 R Axis:   -17 Text Interpretation:  Sinus rhythm Multiple premature complexes, vent & supraven Prolonged PR interval No previous tracing Confirmed by Lajean Saver [phone]) on 02/27/2018 6:29:23 PM   Radiology Dg Chest 2 View  Result Date: 02/27/2018 CLINICAL DATA:  Pain after fall. EXAM: CHEST - 2 VIEW COMPARISON:  Radiograph 12/25/2017, CT 12/31/2017 FINDINGS: Again seen elevation of right  hemidiaphragm. Masslike opacity in the periphery of the right lower lobe is grossly similar less well-defined currently. No pneumothorax. No new focal airspace disease, pleural effusion or pulmonary edema. Mild peripheral scarring in the right midlung. No acute osseous abnormalities are seen. Chronic change about the right acromioclavicular joint. IMPRESSION: 1. No acute or traumatic findings. 2. Unchanged elevation of right hemidiaphragm. Persistent rounded opacity in the periphery of the right lower lobe, masslike opacity on prior chest CT. Given persistence, neoplastic process is favored. Follow-up chest CT is already scheduled. Electronically Signed   By: Keith Rake M.D.   On: 02/27/2018 21:03   Dg Lumbar Spine Complete  Result Date: 02/27/2018 CLINICAL DATA:  Initial encounter for Pt  is presented from [facility] for evaluation post fall suspected to be related to a near syncope episode per family report. EXAM: LUMBAR SPINE - COMPLETE 4+ VIEW COMPARISON:  None. FINDINGS: Five lumbar type vertebral bodies. Left hip arthroplasty. Sacroiliac joints are symmetric. Maintenance of vertebral body height and alignment. Spondylosis involves L3 through S1 with degenerative disc disease and facet arthropathy. Aortic atherosclerosis. IMPRESSION: Spondylosis, without acute osseous abnormality. Aortic Atherosclerosis (ICD10-I70.0). Electronically Signed   By: Abigail Miyamoto M.D.   On: 02/27/2018 21:00   Ct Head Wo Contrast  Result Date: 02/27/2018 CLINICAL DATA:  Post fall with near syncopal episode. EXAM: CT HEAD WITHOUT CONTRAST TECHNIQUE: Contiguous axial images were obtained from the base of the skull through the vertex without intravenous contrast. COMPARISON:  11/19/2016 FINDINGS: Brain: Lateral and third ventricles are within normal. There is mild impression on the fourth ventricle as there are multiple cerebellar masses with the largest measuring 4.3 cm over the left hemisphere. These masses have  low-density center and likely due to metastatic disease. There is mild adjacent vasogenic edema. No definite supratentorial masses identified. No evidence of acute hemorrhage or midline shift. No acute infarction. Mild chronic ischemic microvascular disease. Vascular: No hyperdense vessel or unexpected calcification. Skull: Normal. Negative for fracture or focal lesion. Sinuses/Orbits: No acute finding. Other: None. IMPRESSION: No acute findings. Multiple cerebellar masses with mild adjacent edema and mild mass effect on the fourth ventricle. 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 Mild chronic ischemic microvascular disease. Electronically Signed   By: Marin Olp M.D.   On: 02/27/2018 20:06    Procedures Procedures (including critical care time)  Medications Ordered in ED Medications  acetaminophen (TYLENOL) tablet 650 mg (650 mg Oral Not Given 02/27/18 2015)  morphine 2 MG/ML injection 2 mg (2 mg Intravenous Given 02/27/18 2015)  dexamethasone (DECADRON) injection 10 mg (10 mg Intravenous Given 02/27/18 2055)     Initial Impression / Assessment and Plan / ED Course  I have reviewed the triage vital signs and the nursing notes.  Pertinent labs & imaging results that were available during my care of the patient were reviewed by me and considered in my medical decision making (see chart for details).  Additional history by EMS.  Reviewed nursing notes and prior charts for additional history.   Labs sent.  Pt currently denies pain or injury, states feels fine, at baseline. No faintness or dizziness.   Labs reviewed - chem normal. Possible uti on labs. Will cx and rx.   Ct reviewed - c/w cerebellar mets. No hx malignancy. Discussed w pt.   ?mass on  Cxr, present on prior imaging. ?primary.   Given imbalance, frequent falls, brain mets, will admit.   hospitalists consulted for admission.     Final Clinical Impressions(s) / ED Diagnoses   Final diagnoses:  None    ED  Discharge Orders    None       Lajean Saver, MD 02/27/18 2121

## 2018-02-27 NOTE — ED Triage Notes (Signed)
Pt is presented from [facility] for evaluation post fall suspected to be related to a near syncope episode per family report. Pt has no particular complaints at this time, AOX4.

## 2018-02-28 DIAGNOSIS — N390 Urinary tract infection, site not specified: Secondary | ICD-10-CM

## 2018-02-28 LAB — PSA: Prostatic Specific Antigen: 0.99 ng/mL (ref 0.00–4.00)

## 2018-02-28 MED ORDER — BRIMONIDINE TARTRATE 0.2 % OP SOLN
1.0000 [drp] | Freq: Every day | OPHTHALMIC | Status: DC
  Administered 2018-02-28 – 2018-03-05 (×6): 1 [drp] via OPHTHALMIC
  Filled 2018-02-28: qty 5

## 2018-02-28 MED ORDER — BRIMONIDINE TARTRATE-TIMOLOL 0.2-0.5 % OP SOLN: 1.0000 [drp] | Freq: Every day | OPHTHALMIC | Status: DC

## 2018-02-28 MED ORDER — TIMOLOL MALEATE 0.5 % OP SOLN
1.0000 [drp] | Freq: Every day | OPHTHALMIC | Status: DC
  Administered 2018-02-28 – 2018-03-05 (×6): 1 [drp] via OPHTHALMIC
  Filled 2018-02-28: qty 5

## 2018-02-28 MED ORDER — IOHEXOL 300 MG/ML  SOLN: 30.0000 mL | Freq: Once | INTRAMUSCULAR | Status: DC | PRN

## 2018-02-28 MED ORDER — SODIUM CHLORIDE 0.9 % IV SOLN
INTRAVENOUS | Status: DC
  Administered 2018-02-28 – 2018-03-05 (×6): via INTRAVENOUS

## 2018-02-28 NOTE — Progress Notes (Signed)
[facility]                                                                              Patient Demographics  Brendan Rose, is a 84 y.o. male, DOB - [birth year], UUV:[id number]  Admit date - 02/27/2018   Admitting Physician Gwynne Edinger, MD  Outpatient Primary MD for the patient is Cathlean Sauer, MD  Outpatient specialists:   LOS - 1  days   Medical records reviewed and are as summarized below:    Chief Complaint  Patient presents with  . Fall  . Near Syncope       Brief summary   Patient is a 84 year old male with hypertension gout, hearing deficit presented with a fall at home.  Patient had been unsteady on his feet and had several falls in the last week.  Prior to admission, went to the bathroom and stood up and fell.  Unwitnessed, denied any head pain or pain anywhere else.  No focal neurological deficits.  CT head showed multiple cerebellar masses  Patient was admitted for further work-up.  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  Chest x-ray showed right lower lung opacity rule out malignancy -Patient received dexamethasone 10 mg IV x1 in ED, placed on IV Decadron -Discussed with oncology on-call, Dr. Julien Nordmann, recommended CT chest CT abdomen pelvis.  Ordered  CEA and PSA as well. -Per oncology, patient is 84 year old and if he has significant metastasis or primary is found, recommended consulting palliative for goals of care.    Active Problems:   Essential hypertension -Currently stable, continue home dose of Lasix  GERD Continue PPI  History of anxiety/depression Continue sertraline  Hearing deficit   Code Status: DNR DVT Prophylaxis: Heparin Family Communication: Discussed in detail with the patient, all imaging results, lab results explained to the patient    Disposition Plan:  Time Spent in minutes    25  Procedures:  CT head  Consultants:   Oncology  Antimicrobials:      Medications  Scheduled Meds: . brimonidine  1 drop Both Eyes Daily   And  . timolol  1 drop Both Eyes Daily  . dexamethasone  8 mg Intravenous Q12H  . furosemide  40 mg Oral Daily  . heparin  5,000 Units Subcutaneous Q8H  . pantoprazole  40 mg Oral Daily  . sertraline  50 mg Oral Daily  . sodium chloride flush  3 mL Intravenous Q12H   Continuous Infusions: . sodium chloride    . sodium chloride 75 mL/hr at 02/28/18 1126   PRN Meds:.sodium chloride, iohexol, sodium chloride flush   Antibiotics   Anti-infectives (From admission, onward)   Start     Dose/Rate Route Frequency Ordered Stop   02/27/18 2130  cefTRIAXone (ROCEPHIN) 1 g in sodium chloride 0.9 % 100 mL IVPB     1 g 200 mL/hr over 30 Minutes Intravenous  Once 02/27/18 2121 02/27/18 2228        Subjective:   Brendan Rose was seen and examined today.  Hearing deficit, difficulty obtaining  review of systems.  However patient denies any acute complaints. No fever chills, nausea vomiting.     Objective:   Vitals:   02/27/18 2220 02/27/18 2318 02/28/18 0415 02/28/18 1353  BP: (!) 128/59 139/65 136/70 (!) 173/74  Pulse: (!) 57 64 60 62  Resp: 18 16 18 15   Temp:  98.4 F (36.9 C) 98.2 F (36.8 C) 97.8 F (36.6 C)  TempSrc:  Oral Oral Oral  SpO2: 93% 94% 97% 100%  Weight:  68.6 kg    Height:  5\' 6"  (1.676 m)      Intake/Output Summary (Last 24 hours) at 02/28/2018 1442 Last data filed at 02/28/2018 1029 Gross per 24 hour  Intake 410 ml  Output 925 ml  Net -515 ml     Wt Readings from Last 3 Encounters:  02/27/18 68.6 kg     Exam  General: Alert and oriented x 3, NAD, hearing deficit  Eyes:   HEENT:  Atraumatic, normocephalic, normal oropharynx  Cardiovascular: S1 S2 auscultated, Regular rate and rhythm.  Respiratory: Clear to auscultation bilaterally, no wheezing, rales or rhonchi  Gastrointestinal: Soft,  nontender, nondistended, + bowel sounds  Ext: no pedal edema bilaterally  Neuro: moving all 4 extremities  Musculoskeletal: No digital cyanosis, clubbing  Skin: No rashes  Psych: fairly alert and awake, hearing deficit   Data Reviewed:  I have personally reviewed following labs and imaging studies  Micro Results No results found for this or any previous visit (from the past 240 hour(s)).  Radiology Reports Dg Chest 2 View  Result Date: 02/27/2018 CLINICAL DATA:  Pain after fall. EXAM: CHEST - 2 VIEW COMPARISON:  Radiograph 12/25/2017, CT 12/31/2017 FINDINGS: Again seen elevation of right hemidiaphragm. Masslike opacity in the periphery of the right lower lobe is grossly similar less well-defined currently. No pneumothorax. No new focal airspace disease, pleural effusion or pulmonary edema. Mild peripheral scarring in the right midlung. No acute osseous abnormalities are seen. Chronic change about the right acromioclavicular joint. IMPRESSION: 1. No acute or traumatic findings. 2. Unchanged elevation of right hemidiaphragm. Persistent rounded opacity in the periphery of the right lower lobe, masslike opacity on prior chest CT. Given persistence, neoplastic process is favored. Follow-up chest CT is already scheduled. Electronically Signed   By: Keith Rake M.D.   On: 02/27/2018 21:03   Dg Lumbar Spine Complete  Result Date: 02/27/2018 CLINICAL DATA:  Initial encounter for Pt is presented from [facility] for evaluation post fall suspected to be related to a near syncope episode per family report. EXAM: LUMBAR SPINE - COMPLETE 4+ VIEW COMPARISON:  None. FINDINGS: Five lumbar type vertebral bodies. Left hip arthroplasty. Sacroiliac joints are symmetric. Maintenance of vertebral body height and alignment. Spondylosis involves L3 through S1 with degenerative disc disease and facet arthropathy. Aortic atherosclerosis. IMPRESSION: Spondylosis, without acute osseous abnormality. Aortic  Atherosclerosis (ICD10-I70.0). Electronically Signed   By: Abigail Miyamoto M.D.   On: 02/27/2018 21:00   Ct Head Wo Contrast  Result Date: 02/27/2018 CLINICAL DATA:  Post fall with near syncopal episode. EXAM: CT HEAD WITHOUT CONTRAST TECHNIQUE: Contiguous axial images were obtained from the base of the skull through the vertex without intravenous contrast. COMPARISON:  11/19/2016 FINDINGS: Brain: Lateral and third ventricles are within normal. There is mild impression on the fourth ventricle as there are multiple cerebellar masses with the largest measuring 4.3 cm over the left hemisphere. These masses have low-density center and likely due to metastatic disease. There is mild adjacent vasogenic edema. No  definite supratentorial masses identified. No evidence of acute hemorrhage or midline shift. No acute infarction. Mild chronic ischemic microvascular disease. Vascular: No hyperdense vessel or unexpected calcification. Skull: Normal. Negative for fracture or focal lesion. Sinuses/Orbits: No acute finding. Other: None. IMPRESSION: No acute findings. Multiple cerebellar masses with mild adjacent edema and mild mass effect on the fourth ventricle. 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 Mild chronic ischemic microvascular disease. Electronically Signed   By: Marin Olp M.D.   On: 02/27/2018 20:06    Lab Data:  CBC: Recent Labs  Lab 02/27/18 1950  WBC 6.9  HGB 13.8  HCT 41.9  MCV 93.9  PLT 409   Basic Metabolic Panel: Recent Labs  Lab 02/27/18 1950  NA 141  K 3.9  CL 101  CO2 28  GLUCOSE 113*  BUN 22  CREATININE 1.12  CALCIUM 9.2   GFR: Estimated Creatinine Clearance: 37.2 mL/min (by C-G formula based on SCr of 1.12 mg/dL). Liver Function Tests: Recent Labs  Lab 02/27/18 1950  AST 23  ALT 15  ALKPHOS 61  BILITOT 0.9  PROT 7.0  ALBUMIN 3.8   No results for input(s): LIPASE, AMYLASE in the last 168 hours. No results for input(s): AMMONIA in the last 168  hours. Coagulation Profile: No results for input(s): INR, PROTIME in the last 168 hours. Cardiac Enzymes: No results for input(s): CKTOTAL, CKMB, CKMBINDEX, TROPONINI in the last 168 hours. BNP (last 3 results) No results for input(s): PROBNP in the last 8760 hours. HbA1C: No results for input(s): HGBA1C in the last 72 hours. CBG: No results for input(s): GLUCAP in the last 168 hours. Lipid Profile: No results for input(s): CHOL, HDL, LDLCALC, TRIG, CHOLHDL, LDLDIRECT in the last 72 hours. Thyroid Function Tests: No results for input(s): TSH, T4TOTAL, FREET4, T3FREE, THYROIDAB in the last 72 hours. Anemia Panel: No results for input(s): VITAMINB12, FOLATE, FERRITIN, TIBC, IRON, RETICCTPCT in the last 72 hours. Urine analysis:    Component Value Date/Time   COLORURINE YELLOW 02/27/2018 1925   APPEARANCEUR HAZY (A) 02/27/2018 1925   LABSPEC 1.014 02/27/2018 1925   PHURINE 6.0 02/27/2018 1925   GLUCOSEU NEGATIVE 02/27/2018 1925   HGBUR NEGATIVE 02/27/2018 1925   BILIRUBINUR NEGATIVE 02/27/2018 1925   KETONESUR NEGATIVE 02/27/2018 1925   PROTEINUR NEGATIVE 02/27/2018 1925   NITRITE NEGATIVE 02/27/2018 1925   LEUKOCYTESUR SMALL (A) 02/27/2018 1925     Amro Winebarger M.D. [facility] 02/28/2018, 2:42 PM  Pager: [phone] Between 7am to 7pm - call Pager - [phone]  After 7pm go to [email or web address] - password [email or web address]  Call night coverage person covering after 7pm

## 2018-03-01 ENCOUNTER — Inpatient Hospital Stay (HOSPITAL_COMMUNITY): Payer: Medicare Other

## 2018-03-01 ENCOUNTER — Other Ambulatory Visit: Payer: Self-pay

## 2018-03-01 ENCOUNTER — Encounter (HOSPITAL_COMMUNITY): Payer: Self-pay

## 2018-03-01 LAB — URINE CULTURE

## 2018-03-01 LAB — CBC
HCT: 45.5 % (ref 39.0–52.0)
HEMOGLOBIN: 15.2 g/dL (ref 13.0–17.0)
MCH: 31.4 pg (ref 26.0–34.0)
MCHC: 33.4 g/dL (ref 30.0–36.0)
MCV: 94 fL (ref 78.0–100.0)
Platelets: 274 10*3/uL (ref 150–400)
RBC: 4.84 MIL/uL (ref 4.22–5.81)
RDW: 13.1 % (ref 11.5–15.5)
WBC: 9.3 10*3/uL (ref 4.0–10.5)

## 2018-03-01 LAB — BASIC METABOLIC PANEL
ANION GAP: 11 (ref 5–15)
BUN: 30 mg/dL — ABNORMAL HIGH (ref 8–23)
CO2: 31 mmol/L (ref 22–32)
Calcium: 9.8 mg/dL (ref 8.9–10.3)
Chloride: 97 mmol/L — ABNORMAL LOW (ref 98–111)
Creatinine, Ser: 0.94 mg/dL (ref 0.61–1.24)
GFR calc Af Amer: >60 mL/min (ref >60)
GFR calc non Af Amer: >60 mL/min (ref >60)
GLUCOSE: 133 mg/dL — AB (ref 70–99)
POTASSIUM: 3.8 mmol/L (ref 3.5–5.1)
Sodium: 139 mmol/L (ref 135–145)

## 2018-03-01 LAB — CEA: CEA: 3.3 ng/mL (ref 0.0–4.7)

## 2018-03-01 MED ORDER — IOPAMIDOL (ISOVUE-300) INJECTION 61%: 15.0000 mL | Freq: Once | INTRAVENOUS | Status: DC | PRN

## 2018-03-01 MED ORDER — IOPAMIDOL (ISOVUE-300) INJECTION 61%
INTRAVENOUS | Status: AC
  Administered 2018-03-01: 15 mL
  Filled 2018-03-01: qty 30

## 2018-03-01 MED ORDER — ONDANSETRON HCL 4 MG/2ML IJ SOLN: 4.0000 mg | Freq: Four times a day (QID) | INTRAMUSCULAR | Status: DC | PRN

## 2018-03-01 MED ORDER — HYDRALAZINE HCL 20 MG/ML IJ SOLN
10.0000 mg | Freq: Four times a day (QID) | INTRAMUSCULAR | Status: DC | PRN
  Administered 2018-03-01: 10 mg via INTRAVENOUS
  Filled 2018-03-01: qty 1

## 2018-03-01 MED ORDER — IOHEXOL 300 MG/ML  SOLN
100.0000 mL | Freq: Once | INTRAMUSCULAR | Status: AC | PRN
  Administered 2018-03-01: 100 mL via INTRAVENOUS

## 2018-03-01 NOTE — Progress Notes (Addendum)
[facility]                                                                              Patient Demographics  Brendan Rose, is a 84 y.o. male, DOB - February 28, 1922, ZOX:[id number]  Admit date - 02/27/2018   Admitting Physician Gwynne Edinger, MD  Outpatient Primary MD for the patient is Cathlean Sauer, MD  Outpatient specialists:   LOS - 2  days   Medical records reviewed and are as summarized below:    Chief Complaint  Patient presents with  . Fall  . Near Syncope       Brief summary   Patient is a 84 year old male with hypertension gout, hearing deficit presented with a fall at home.  Patient had been unsteady on his feet and had several falls in the last week.  Prior to admission, went to the bathroom and stood up and fell.  Unwitnessed, denied any head pain or pain anywhere else.  No focal neurological deficits.  CT head showed multiple cerebellar masses  Patient was admitted for further work-up.  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  Chest x-ray showed right lower lung opacity rule out malignancy -Patient received dexamethasone 10 mg IV x1 in ED, placed on IV Decadron -Discussed with Dr. Julien Nordmann on 9/15, recommended CT chest, abdomen pelvis.   -Per oncology, patient is 84 year old and if he has significant metastasis or primary is found, recommended consulting palliative for goals of care.   -CT chest abdomen pelvis still pending.  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  -Discussed in detail with patient's daughter, who agreed with palliative management at this time, not interested in aggressive interventions.  Active Problems:   Essential hypertension -Continue home dose of Lasix, hydralazine IV as needed with parameters  GERD Continue PPI  History of anxiety/depression Continue sertraline  Hearing deficit -Stable   Code Status: DNR DVT Prophylaxis: Heparin Family Communication: Discussed in detail with the patient, all imaging results, lab results explained to the patient, son and daughter    Disposition Plan: Pending CT imagings of the chest abdomen pelvis  Time Spent in minutes   25  Procedures:  CT head  Consultants:   Oncology  Antimicrobials:      Medications  Scheduled Meds: . brimonidine  1 drop Both Eyes Daily   And  . timolol  1 drop Both Eyes Daily  . dexamethasone  8 mg Intravenous Q12H  . furosemide  40 mg Oral Daily  . heparin  5,000 Units Subcutaneous Q8H  . pantoprazole  40 mg Oral Daily  . sertraline  50 mg Oral Daily  . sodium chloride flush  3 mL Intravenous  Q12H   Continuous Infusions: . sodium chloride    . sodium chloride 75 mL/hr at 02/28/18 1126   PRN Meds:.sodium chloride, iohexol, iopamidol, sodium chloride flush   Antibiotics   Anti-infectives (From admission, onward)   Start     Dose/Rate Route Frequency Ordered Stop   02/27/18 2130  cefTRIAXone (ROCEPHIN) 1 g in sodium chloride 0.9 % 100 mL IVPB     1 g 200 mL/hr over 30 Minutes Intravenous  Once 02/27/18 2121 02/27/18 2228        Subjective:   Brendan Rose was seen and examined today.  Sitting up in the chair, denies any specific complaints, asking "what are we doing now?'.  No acute issues overnight.  No fever chills, chest pain, shortness of breath, nausea vomiting abdominal pain or diarrhea.    Objective:   Vitals:   02/28/18 1353 02/28/18 2222 03/01/18 0445  03/01/18 1223  BP: (!) 173/74 (!) 148/78 132/74 (!) 172/70  Pulse: 62 63 60 (!) 52  Resp: 15 16 16 18   Temp: 97.8 F (36.6 C) (!) 97.4 F (36.3 C) 97.9 F (36.6 C)   TempSrc: Oral Oral Oral   SpO2: 100% 95% 99% 100%  Weight:      Height:        Intake/Output Summary (Last 24 hours) at 03/01/2018 1344 Last data filed at 03/01/2018 0445 Gross per 24 hour  Intake -  Output 1250 ml  Net -1250 ml     Wt Readings from Last 3 Encounters:  02/27/18 68.6 kg     Exam    General: Alert and oriented x 3, NAD  Eyes:  HEENT:  Atraumatic, normocephalic  Cardiovascular: S1 S2 auscultated, RRR. No pedal edema b/l  Respiratory: CTAB  Gastrointestinal: Soft, nontender, nondistended, + bowel sounds  Ext: no pedal edema bilaterally  Neuro: no new deficits  Musculoskeletal: No digital cyanosis, clubbing  Skin: No rashes  Psych: Normal affect and demeanor, alert and oriented x3     Data Reviewed:  I have personally reviewed following labs and imaging studies  Micro Results Recent Results (from the past 240 hour(s))  Urine Culture     Status: Abnormal   Collection Time: 02/27/18  7:25 PM  Result Value Ref Range Status   Specimen Description   Final    URINE, RANDOM Performed at [facility] [address], [place], [place] [address]    Special Requests   Final    NONE Performed at [facility], [place] [address] [place], [place] [address]    Culture MULTIPLE SPECIES PRESENT, SUGGEST RECOLLECTION (A)  Final   Report Status 03/01/2018 FINAL  Final    Radiology Reports Dg Chest 2 View  Result Date: 02/27/2018 CLINICAL DATA:  Pain after fall. EXAM: CHEST - 2 VIEW COMPARISON:  Radiograph 12/25/2017, CT 12/31/2017 FINDINGS: Again seen elevation of right hemidiaphragm. Masslike opacity in the periphery of the right lower lobe is grossly similar less well-defined currently. No pneumothorax. No new focal airspace disease, pleural  effusion or pulmonary edema. Mild peripheral scarring in the right midlung. No acute osseous abnormalities are seen. Chronic change about the right acromioclavicular joint. IMPRESSION: 1. No acute or traumatic findings. 2. Unchanged elevation of right hemidiaphragm. Persistent rounded opacity in the periphery of the right lower lobe, masslike opacity on prior chest CT. Given persistence, neoplastic process is favored. Follow-up chest CT is already scheduled. Electronically Signed   By: Keith Rake M.D.   On: 02/27/2018 21:03  Dg Lumbar Spine Complete  Result Date: 02/27/2018 CLINICAL DATA:  Initial encounter for Pt is presented from [facility] for evaluation post fall suspected to be related to a near syncope episode per family report. EXAM: LUMBAR SPINE - COMPLETE 4+ VIEW COMPARISON:  None. FINDINGS: Five lumbar type vertebral bodies. Left hip arthroplasty. Sacroiliac joints are symmetric. Maintenance of vertebral body height and alignment. Spondylosis involves L3 through S1 with degenerative disc disease and facet arthropathy. Aortic atherosclerosis. IMPRESSION: Spondylosis, without acute osseous abnormality. Aortic Atherosclerosis (ICD10-I70.0). Electronically Signed   By: Abigail Miyamoto M.D.   On: 02/27/2018 21:00   Ct Head Wo Contrast  Result Date: 02/27/2018 CLINICAL DATA:  Post fall with near syncopal episode. EXAM: CT HEAD WITHOUT CONTRAST TECHNIQUE: Contiguous axial images were obtained from the base of the skull through the vertex without intravenous contrast. COMPARISON:  11/19/2016 FINDINGS: Brain: Lateral and third ventricles are within normal. There is mild impression on the fourth ventricle as there are multiple cerebellar masses with the largest measuring 4.3 cm over the left hemisphere. These masses have low-density center and likely due to metastatic disease. There is mild adjacent vasogenic edema. No definite supratentorial masses identified. No evidence of acute hemorrhage or  midline shift. No acute infarction. Mild chronic ischemic microvascular disease. Vascular: No hyperdense vessel or unexpected calcification. Skull: Normal. Negative for fracture or focal lesion. Sinuses/Orbits: No acute finding. Other: None. IMPRESSION: No acute findings. Multiple cerebellar masses with mild adjacent edema and mild mass effect on the fourth ventricle. 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 Mild chronic ischemic microvascular disease. Electronically Signed   By: Marin Olp M.D.   On: 02/27/2018 20:06    Lab Data:  CBC: Recent Labs  Lab 02/27/18 1950 03/01/18 0457  WBC 6.9 9.3  HGB 13.8 15.2  HCT 41.9 45.5  MCV 93.9 94.0  PLT 238 644   Basic Metabolic Panel: Recent Labs  Lab 02/27/18 1950 03/01/18 0457  NA 141 139  K 3.9 3.8  CL 101 97*  CO2 28 31  GLUCOSE 113* 133*  BUN 22 30*  CREATININE 1.12 0.94  CALCIUM 9.2 9.8   GFR: Estimated Creatinine Clearance: 44.3 mL/min (by C-G formula based on SCr of 0.94 mg/dL). Liver Function Tests: Recent Labs  Lab 02/27/18 1950  AST 23  ALT 15  ALKPHOS 61  BILITOT 0.9  PROT 7.0  ALBUMIN 3.8   No results for input(s): LIPASE, AMYLASE in the last 168 hours. No results for input(s): AMMONIA in the last 168 hours. Coagulation Profile: No results for input(s): INR, PROTIME in the last 168 hours. Cardiac Enzymes: No results for input(s): CKTOTAL, CKMB, CKMBINDEX, TROPONINI in the last 168 hours. BNP (last 3 results) No results for input(s): PROBNP in the last 8760 hours. HbA1C: No results for input(s): HGBA1C in the last 72 hours. CBG: No results for input(s): GLUCAP in the last 168 hours. Lipid Profile: No results for input(s): CHOL, HDL, LDLCALC, TRIG, CHOLHDL, LDLDIRECT in the last 72 hours. Thyroid Function Tests: No results for input(s): TSH, T4TOTAL, FREET4, T3FREE, THYROIDAB in the last 72 hours. Anemia Panel: No results for input(s): VITAMINB12, FOLATE, FERRITIN, TIBC, IRON, RETICCTPCT in the last  72 hours. Urine analysis:    Component Value Date/Time   COLORURINE YELLOW 02/27/2018 1925   APPEARANCEUR HAZY (A) 02/27/2018 1925   LABSPEC 1.014 02/27/2018 1925   PHURINE 6.0 02/27/2018 1925   GLUCOSEU NEGATIVE 02/27/2018 1925   HGBUR NEGATIVE 02/27/2018 [place] NEGATIVE 02/27/2018 1925  KETONESUR NEGATIVE 02/27/2018 1925   PROTEINUR NEGATIVE 02/27/2018 1925   NITRITE NEGATIVE 02/27/2018 1925   LEUKOCYTESUR SMALL (A) 02/27/2018 1925     Estill Cotta M.D. [facility] 03/01/2018, 1:44 PM  Pager: [phone] Between 7am to 7pm - call Pager - [phone]  After 7pm go to [email or web address] - password [email or web address]  Call night coverage person covering after 7pm

## 2018-03-01 NOTE — Progress Notes (Signed)
CSW notified that pt is resident of [facility]. Will follow for disposition needs.  Sharren Bridge, MSW, LCSW Clinical Social Work 03/01/2018 [phone]

## 2018-03-02 ENCOUNTER — Inpatient Hospital Stay
Admit: 2018-03-02 | Discharge: 2018-03-02 | Disposition: A | Discharge status: Home / Self Care | Payer: Federal, State, Local not specified - PPO | Attending: Radiation Oncology | Admitting: Radiation Oncology

## 2018-03-02 ENCOUNTER — Ambulatory Visit: Admit: 2018-03-02 | Payer: Federal, State, Local not specified - PPO | Admitting: Radiation Oncology

## 2018-03-02 LAB — CBC
HCT: 39.6 % (ref 39.0–52.0)
Hemoglobin: 13.4 g/dL (ref 13.0–17.0)
MCH: 31.2 pg (ref 26.0–34.0)
MCHC: 33.8 g/dL (ref 30.0–36.0)
MCV: 92.3 fL (ref 78.0–100.0)
PLATELETS: 244 10*3/uL (ref 150–400)
RBC: 4.29 MIL/uL (ref 4.22–5.81)
RDW: 13.4 % (ref 11.5–15.5)
WBC: 8.1 10*3/uL (ref 4.0–10.5)

## 2018-03-02 LAB — BASIC METABOLIC PANEL
Anion gap: 9 (ref 5–15)
BUN: 36 mg/dL — AB (ref 8–23)
CHLORIDE: 101 mmol/L (ref 98–111)
CO2: 26 mmol/L (ref 22–32)
Calcium: 8.9 mg/dL (ref 8.9–10.3)
Creatinine, Ser: 1.12 mg/dL (ref 0.61–1.24)
GFR calc Af Amer: >60 mL/min (ref >60)
GFR, EST NON AFRICAN AMERICAN: 55 mL/min — AB (ref >60)
Glucose, Bld: 134 mg/dL — ABNORMAL HIGH (ref 70–99)
POTASSIUM: 3.7 mmol/L (ref 3.5–5.1)
SODIUM: 136 mmol/L (ref 135–145)

## 2018-03-02 NOTE — Progress Notes (Addendum)
I spoke with the patient's step daughter Leonard Downing, who happens to be a paramedic. I called her after reviewing the notes that indicate the patient and the family are not interested in aggressive work up or treatment. It appears the patient has been followed at [facility] in pulmonary medicine for a large right lower lobe mass. In July it was 6 cm in greatest dimension but was a non contrasted study. He was supposed to go back for evaluation next week with contrast, but fell at home and was admitted. Upon arrival, he was found to have multiple metastatic appearing lesions in the brain on CT imaging. He has multiple large lesions involving the cerebellum, causing compression of the 4th ventricle, as well as a large mass in the RLL, mediastinal adenopathy, and concerns for metastatic disease to the liver. I spent about 25 minutes by phone with Ms. Sabra Heck. We reviewed the findings, and the concerns about treatment related expectations given that he would not likely be a candidate for systemic chemotherapy, and that local therapy with radiation would not be curative, and potententially extend his life without enhancing his quality of life longterm. She is in favor of meeting with palliative care and coordinating outpatient hospice care. I am in agreement that this makes the most sense clinically. I would be happy to discuss this further during a family meeting as desired by the family. Otherwise she does not desire formal consultation.      Carola Rhine, PAC

## 2018-03-02 NOTE — Progress Notes (Signed)
PT Cancellation Note  Patient Details Name: Brendan Rose MRN: [id number] DOB: 04/11/1922   Cancelled Treatment:    Reason Eval/Treat Not Completed: Other (comment)Patient to have Palliative medicine consult for goals of care in setting of brain lesions. Will check for  Indication for PT.    Charlee Whitebread [place] Pager [phone] Office [phone]  03/02/2018, 10:21 AM

## 2018-03-02 NOTE — Care Management Important Message (Signed)
Important Message  Patient Details  Name: Brendan Rose MRN: [id number] Date of Birth: 1922/06/06   Medicare Important Message Given:  Yes    Kerin Salen 03/02/2018, 10:50 AMImportant Message  Patient Details  Name: Brendan Rose MRN: [id number] Date of Birth: [birth year]   Medicare Important Message Given:  Yes    Kerin Salen 03/02/2018, 10:50 AM

## 2018-03-02 NOTE — Progress Notes (Signed)
[facility]                                                                              Patient Demographics  Brendan Rose, is a 84 y.o. male, DOB - [birth year], MGQ:[id number]  Admit date - 02/27/2018   Admitting Physician Gwynne Edinger, MD  Outpatient Primary MD for the patient is Cathlean Sauer, MD  Outpatient specialists:   LOS - 3  days   Medical records reviewed and are as summarized below:    Chief Complaint  Patient presents with  . Fall  . Near Syncope       Brief summary   Patient is a 84 year old male with hypertension gout, hearing deficit presented with a fall at home.  Patient had been unsteady on his feet and had several falls in the last week.  Prior to admission, went to the bathroom and stood up and fell.  Unwitnessed, denied any head pain or pain anywhere else.  No focal neurological deficits.  CT head showed multiple cerebellar masses  Patient was admitted for further work-up.  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  Chest x-ray showed right lower lung opacity rule out malignancy -Patient received dexamethasone 10 mg IV x1 in ED, placed on IV Decadron -Discussed with Dr. Julien Nordmann on 9/15, recommended CT chest, abdomen pelvis.   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 Lindi Adie, who does not feel chemotherapy or any other intervention would be curative at this time, recommended discussing with radiation oncology if palliative radiation would help -Radiation oncology consulted, appreciate their recommendations, also recommended palliative approach -Discussed in detail with patient's daughter,  who agreed with palliative management at this time, not interested in aggressive interventions. -Palliative medicine has been consulted  Active Problems:   Essential hypertension -Continue home dose of Lasix, hydralazine IV as needed with parameters  GERD Continue PPI  History of anxiety/depression Continue sertraline  Hearing deficit -Stable   Code Status: DNR DVT Prophylaxis: Heparin Family Communication: Discussed in detail with the patient, all imaging results, lab results explained to the patient and daughter   Disposition Plan: Awaiting palliative goals of care  Time Spent in minutes   25  Procedures:  CT head CT abdomen pelvis chest  Consultants:   Oncology Palliative medicine Radiation oncology  Antimicrobials:      Medications  Scheduled Meds: . brimonidine  1 drop Both Eyes Daily   And  . timolol  1 drop Both Eyes Daily  . dexamethasone  8 mg Intravenous Q12H  . furosemide  40 mg Oral Daily  . heparin  5,000 Units Subcutaneous Q8H  . pantoprazole  40 mg Oral Daily  . sertraline  50 mg Oral Daily  . sodium chloride flush  3 mL Intravenous Q12H   Continuous Infusions: . sodium chloride    . sodium  chloride 75 mL/hr at 02/28/18 1126   PRN Meds:.sodium chloride, hydrALAZINE, iohexol, iopamidol, ondansetron (ZOFRAN) IV, sodium chloride flush   Antibiotics   Anti-infectives (From admission, onward)   Start     Dose/Rate Route Frequency Ordered Stop   02/27/18 2130  cefTRIAXone (ROCEPHIN) 1 g in sodium chloride 0.9 % 100 mL IVPB     1 g 200 mL/hr over 30 Minutes Intravenous  Once 02/27/18 2121 02/27/18 2228        Subjective:   Brendan Rose was seen and examined today.  Waking up, denies any specific complaints.  No acute issues overnight.  Currently no pain.  No chest pain, shortness of breath, nausea vomiting or diarrhea.  Objective:   Vitals:   03/01/18 1223 03/01/18 2145 03/02/18 0627 03/02/18 1320  BP: (!) 172/70 136/76  136/79 (!) 148/53  Pulse: (!) 52 66 69 72  Resp: 18 17 17 16   Temp:  97.6 F (36.4 C) 97.6 F (36.4 C) 98.3 F (36.8 C)  TempSrc:  Oral Oral Oral  SpO2: 100% 100% 100% 95%  Weight:      Height:        Intake/Output Summary (Last 24 hours) at 03/02/2018 1432 Last data filed at 03/02/2018 0900 Gross per 24 hour  Intake 600 ml  Output 200 ml  Net 400 ml     Wt Readings from Last 3 Encounters:  02/27/18 68.6 kg     Exam   General: Alert and oriented x 3, NAD, hearing deficit  Eyes:   HEENT:  Atraumatic, normocephalic  Cardiovascular: S1 S2 auscultated, RRR. No pedal edema b/l  Respiratory: Clear to auscultation bilaterally, no wheezing, rales or rhonchi  Gastrointestinal: Soft, nontender, nondistended, + bowel sounds  Ext: no pedal edema bilaterally  Neuro: no new deficits  Musculoskeletal: No digital cyanosis, clubbing  Skin: No rashes  Psych: Normal affect and demeanor, alert and oriented x3     Data Reviewed:  I have personally reviewed following labs and imaging studies  Micro Results Recent Results (from the past 240 hour(s))  Urine Culture     Status: Abnormal   Collection Time: 02/27/18  7:25 PM  Result Value Ref Range Status   Specimen Description   Final    URINE, RANDOM Performed at [facility] [address], [place], [place] [address]    Special Requests   Final    NONE Performed at [facility], [place] [address], [place], [place] [address]    Culture MULTIPLE SPECIES PRESENT, SUGGEST RECOLLECTION (A)  Final   Report Status 03/01/2018 FINAL  Final    Radiology Reports Dg Chest 2 View  Result Date: 02/27/2018 CLINICAL DATA:  Pain after fall. EXAM: CHEST - 2 VIEW COMPARISON:  Radiograph 12/25/2017, CT 12/31/2017 FINDINGS: Again seen elevation of right hemidiaphragm. Masslike opacity in the periphery of the right lower lobe is grossly similar less well-defined currently. No pneumothorax. No new focal  airspace disease, pleural effusion or pulmonary edema. Mild peripheral scarring in the right midlung. No acute osseous abnormalities are seen. Chronic change about the right acromioclavicular joint. IMPRESSION: 1. No acute or traumatic findings. 2. Unchanged elevation of right hemidiaphragm. Persistent rounded opacity in the periphery of the right lower lobe, masslike opacity on prior chest CT. Given persistence, neoplastic process is favored. Follow-up chest CT is already scheduled. Electronically Signed   By: Keith Rake M.D.   On: 02/27/2018 21:03   Dg Lumbar Spine Complete  Result Date: 02/27/2018 CLINICAL DATA:  Initial encounter for Pt is presented from [facility] for evaluation post fall suspected to be related to a near syncope episode per family report. EXAM: LUMBAR SPINE - COMPLETE 4+ VIEW COMPARISON:  None. FINDINGS: Five lumbar type vertebral bodies. Left hip arthroplasty. Sacroiliac joints are symmetric. Maintenance of vertebral body height and alignment. Spondylosis involves L3 through S1 with degenerative disc disease and facet arthropathy. Aortic atherosclerosis. IMPRESSION: Spondylosis, without acute osseous abnormality. Aortic Atherosclerosis (ICD10-I70.0). Electronically Signed   By: Abigail Miyamoto M.D.   On: 02/27/2018 21:00   Ct Head Wo Contrast  Result Date: 02/27/2018 CLINICAL DATA:  Post fall with near syncopal episode. EXAM: CT HEAD WITHOUT CONTRAST TECHNIQUE: Contiguous axial images were obtained from the base of the skull through the vertex without intravenous contrast. COMPARISON:  11/19/2016 FINDINGS: Brain: Lateral and third ventricles are within normal. There is mild impression on the fourth ventricle as there are multiple cerebellar masses with the largest measuring 4.3 cm over the left hemisphere. These masses have low-density center and likely due to metastatic disease. There is mild adjacent vasogenic edema. No definite supratentorial masses identified. No evidence  of acute hemorrhage or midline shift. No acute infarction. Mild chronic ischemic microvascular disease. Vascular: No hyperdense vessel or unexpected calcification. Skull: Normal. Negative for fracture or focal lesion. Sinuses/Orbits: No acute finding. Other: None. IMPRESSION: No acute findings. Multiple cerebellar masses with mild adjacent edema and mild mass effect on the fourth ventricle. 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 Mild chronic ischemic microvascular disease. Electronically Signed   By: Marin Olp M.D.   On: 02/27/2018 20:06   Ct Chest W Contrast  Result Date: 03/01/2018 CLINICAL DATA:  84 year old male with ataxia and abnormal head CT with cerebellar masses raising possibly metastatic disease. Subsequent encounter. EXAM: CT CHEST, ABDOMEN, AND PELVIS WITH CONTRAST TECHNIQUE: Multidetector CT imaging of the chest, abdomen and pelvis was performed following the standard protocol during bolus administration of intravenous contrast. CONTRAST:  169mL OMNIPAQUE IOHEXOL 300 MG/ML  SOLN COMPARISON:  01/04/2018 and 08/06/2016 chest CT. FINDINGS: CT CHEST FINDINGS Cardiovascular: No central pulmonary embolus. No aortic dissection or aneurysm. Atherosclerotic changes aorta and great vessels. Heart top-normal. Coronary artery calcifications. Mediastinum/Nodes: Right hilar adenopathy measuring up to 3.5 cm. Subcarinal adenopathy with 2.5 cm short axis dimension. Lungs/Pleura: Elevated right hemidiaphragm. Posterior right lower lobe pleural based 5.8 x 5.5 x 5.6 cm mass. This is highly suspicious for primary malignancy causing narrowing of supplying bronchi and vessels. There may be pleural spread of tumor as there is a 1 x 1.2 x 1.1 cm nodular opacity inferolateral to the mass. Baseline emphysema.  Trachea and mainstem bronchi are patent. Musculoskeletal: Degenerative changes lower cervical and thoracic spine without osseous destructive lesion. CT ABDOMEN PELVIS FINDINGS Hepatobiliary: In addition to  liver cysts, suspect 1.1 cm metastatic lesion right lobe liver (series 2, image 45 and series 5, image 85). No calcified gallstone or CT evidence of inflammation. Pancreas: No worrisome pancreatic mass or inflammation. Spleen: No splenic mass or enlargement. Adrenals/Urinary Tract: No obstructing stone or hydronephrosis. Scattered renal lesions greater on left, larger ones which are cysts and others too small to characterize. No adrenal lesion. Limited evaluation urinary bladder secondary to lack of contrast and streak artifact from hip replacement. No gross abnormality noted. Stomach/Bowel: Diverticulosis most notable sigmoid colon. No evidence of diverticulitis. Under distended stomach without gross abnormality. Vascular/Lymphatic: Atherosclerotic changes aorta and aortic branch vessels. No abdominal aortic aneurysm or large vessel occlusion. Scattered normal size abdominal/pelvic lymph nodes.  Reproductive: Evaluation limited by artifact from hip replacement. Other: No free air or bowel containing hernia. Musculoskeletal: Degenerative changes L3-4 and L4-5. Right hip joint degenerative changes. Prior left hip replacement. No osseous destructive lesion. IMPRESSION: 1. Posterior right lower lobe pleural based 5.8 x 5.5 x 5.6 cm mass. This is highly suspicious for primary malignancy. There may be pleural spread of tumor as there is a 1 x 1.2 x 1.1 cm nodular opacity inferolateral to the mass. 2.  Emphysema (ICD10-J43.9). 3. Right hilar adenopathy measuring up to 3.5 cm. Subcarinal adenopathy with 2.5 cm short axis dimension. 4. Suspect 1.1 cm metastatic lesion within the right lobe of the liver (series 2, image 45 and series 5, image 85). 5. Sigmoid diverticulosis. 6.  Aortic Atherosclerosis (ICD10-I70.0). Electronically Signed   By: Genia Del M.D.   On: 03/01/2018 14:01   Ct Abdomen Pelvis W Contrast  Result Date: 03/01/2018 CLINICAL DATA:  84 year old male with ataxia and abnormal head CT with cerebellar  masses raising possibly metastatic disease. Subsequent encounter. EXAM: CT CHEST, ABDOMEN, AND PELVIS WITH CONTRAST TECHNIQUE: Multidetector CT imaging of the chest, abdomen and pelvis was performed following the standard protocol during bolus administration of intravenous contrast. CONTRAST:  149mL OMNIPAQUE IOHEXOL 300 MG/ML  SOLN COMPARISON:  01/04/2018 and 08/06/2016 chest CT. FINDINGS: CT CHEST FINDINGS Cardiovascular: No central pulmonary embolus. No aortic dissection or aneurysm. Atherosclerotic changes aorta and great vessels. Heart top-normal. Coronary artery calcifications. Mediastinum/Nodes: Right hilar adenopathy measuring up to 3.5 cm. Subcarinal adenopathy with 2.5 cm short axis dimension. Lungs/Pleura: Elevated right hemidiaphragm. Posterior right lower lobe pleural based 5.8 x 5.5 x 5.6 cm mass. This is highly suspicious for primary malignancy causing narrowing of supplying bronchi and vessels. There may be pleural spread of tumor as there is a 1 x 1.2 x 1.1 cm nodular opacity inferolateral to the mass. Baseline emphysema.  Trachea and mainstem bronchi are patent. Musculoskeletal: Degenerative changes lower cervical and thoracic spine without osseous destructive lesion. CT ABDOMEN PELVIS FINDINGS Hepatobiliary: In addition to liver cysts, suspect 1.1 cm metastatic lesion right lobe liver (series 2, image 45 and series 5, image 85). No calcified gallstone or CT evidence of inflammation. Pancreas: No worrisome pancreatic mass or inflammation. Spleen: No splenic mass or enlargement. Adrenals/Urinary Tract: No obstructing stone or hydronephrosis. Scattered renal lesions greater on left, larger ones which are cysts and others too small to characterize. No adrenal lesion. Limited evaluation urinary bladder secondary to lack of contrast and streak artifact from hip replacement. No gross abnormality noted. Stomach/Bowel: Diverticulosis most notable sigmoid colon. No evidence of diverticulitis. Under  distended stomach without gross abnormality. Vascular/Lymphatic: Atherosclerotic changes aorta and aortic branch vessels. No abdominal aortic aneurysm or large vessel occlusion. Scattered normal size abdominal/pelvic lymph nodes. Reproductive: Evaluation limited by artifact from hip replacement. Other: No free air or bowel containing hernia. Musculoskeletal: Degenerative changes L3-4 and L4-5. Right hip joint degenerative changes. Prior left hip replacement. No osseous destructive lesion. IMPRESSION: 1. Posterior right lower lobe pleural based 5.8 x 5.5 x 5.6 cm mass. This is highly suspicious for primary malignancy. There may be pleural spread of tumor as there is a 1 x 1.2 x 1.1 cm nodular opacity inferolateral to the mass. 2.  Emphysema (ICD10-J43.9). 3. Right hilar adenopathy measuring up to 3.5 cm. Subcarinal adenopathy with 2.5 cm short axis dimension. 4. Suspect 1.1 cm metastatic lesion within the right lobe of the liver (series 2, image 45 and series 5, image 85). 5. Sigmoid diverticulosis. 6.  Aortic Atherosclerosis (ICD10-I70.0). Electronically Signed   By: Genia Del M.D.   On: 03/01/2018 14:01    Lab Data:  CBC: Recent Labs  Lab 02/27/18 1950 03/01/18 0457 03/02/18 0331  WBC 6.9 9.3 8.1  HGB 13.8 15.2 13.4  HCT 41.9 45.5 39.6  MCV 93.9 94.0 92.3  PLT 238 274 803   Basic Metabolic Panel: Recent Labs  Lab 02/27/18 1950 03/01/18 0457 03/02/18 0331  NA 141 139 136  K 3.9 3.8 3.7  CL 101 97* 101  CO2 28 31 26   GLUCOSE 113* 133* 134*  BUN 22 30* 36*  CREATININE 1.12 0.94 1.12  CALCIUM 9.2 9.8 8.9   GFR: Estimated Creatinine Clearance: 37.2 mL/min (by C-G formula based on SCr of 1.12 mg/dL). Liver Function Tests: Recent Labs  Lab 02/27/18 1950  AST 23  ALT 15  ALKPHOS 61  BILITOT 0.9  PROT 7.0  ALBUMIN 3.8   No results for input(s): LIPASE, AMYLASE in the last 168 hours. No results for input(s): AMMONIA in the last 168 hours. Coagulation Profile: No results for  input(s): INR, PROTIME in the last 168 hours. Cardiac Enzymes: No results for input(s): CKTOTAL, CKMB, CKMBINDEX, TROPONINI in the last 168 hours. BNP (last 3 results) No results for input(s): PROBNP in the last 8760 hours. HbA1C: No results for input(s): HGBA1C in the last 72 hours. CBG: No results for input(s): GLUCAP in the last 168 hours. Lipid Profile: No results for input(s): CHOL, HDL, LDLCALC, TRIG, CHOLHDL, LDLDIRECT in the last 72 hours. Thyroid Function Tests: No results for input(s): TSH, T4TOTAL, FREET4, T3FREE, THYROIDAB in the last 72 hours. Anemia Panel: No results for input(s): VITAMINB12, FOLATE, FERRITIN, TIBC, IRON, RETICCTPCT in the last 72 hours. Urine analysis:    Component Value Date/Time   COLORURINE YELLOW 02/27/2018 1925   APPEARANCEUR HAZY (A) 02/27/2018 1925   LABSPEC 1.014 02/27/2018 1925   PHURINE 6.0 02/27/2018 1925   GLUCOSEU NEGATIVE 02/27/2018 1925   HGBUR NEGATIVE 02/27/2018 1925   BILIRUBINUR NEGATIVE 02/27/2018 1925   KETONESUR NEGATIVE 02/27/2018 1925   PROTEINUR NEGATIVE 02/27/2018 1925   NITRITE NEGATIVE 02/27/2018 1925   LEUKOCYTESUR SMALL (A) 02/27/2018 1925     Inella Kuwahara M.D. [facility] 03/02/2018, 2:32 PM  Pager: [phone] Between 7am to 7pm - call Pager - [phone]  After 7pm go to [email or web address] - password [email or web address]  Call night coverage person covering after 7pm

## 2018-03-03 DIAGNOSIS — Y92009 Unspecified place in unspecified non-institutional (private) residence as the place of occurrence of the external cause: Secondary | ICD-10-CM

## 2018-03-03 DIAGNOSIS — R269 Unspecified abnormalities of gait and mobility: Secondary | ICD-10-CM

## 2018-03-03 DIAGNOSIS — H9113 Presbycusis, bilateral: Secondary | ICD-10-CM

## 2018-03-03 DIAGNOSIS — R531 Weakness: Secondary | ICD-10-CM

## 2018-03-03 DIAGNOSIS — I10 Essential (primary) hypertension: Secondary | ICD-10-CM

## 2018-03-03 DIAGNOSIS — R270 Ataxia, unspecified: Secondary | ICD-10-CM

## 2018-03-03 DIAGNOSIS — Z515 Encounter for palliative care: Secondary | ICD-10-CM

## 2018-03-03 DIAGNOSIS — W19XXXA Unspecified fall, initial encounter: Secondary | ICD-10-CM

## 2018-03-03 DIAGNOSIS — R911 Solitary pulmonary nodule: Secondary | ICD-10-CM

## 2018-03-03 DIAGNOSIS — G939 Disorder of brain, unspecified: Secondary | ICD-10-CM

## 2018-03-03 DIAGNOSIS — Z66 Do not resuscitate: Secondary | ICD-10-CM

## 2018-03-03 LAB — CBC
HCT: 41.9 % (ref 39.0–52.0)
Hemoglobin: 13.5 g/dL (ref 13.0–17.0)
MCH: 30.5 pg (ref 26.0–34.0)
MCHC: 32.2 g/dL (ref 30.0–36.0)
MCV: 94.8 fL (ref 78.0–100.0)
PLATELETS: 225 10*3/uL (ref 150–400)
RBC: 4.42 MIL/uL (ref 4.22–5.81)
RDW: 13.4 % (ref 11.5–15.5)
WBC: 8.4 10*3/uL (ref 4.0–10.5)

## 2018-03-03 LAB — BASIC METABOLIC PANEL
ANION GAP: 8 (ref 5–15)
BUN: 34 mg/dL — AB (ref 8–23)
CALCIUM: 8.5 mg/dL — AB (ref 8.9–10.3)
CO2: 26 mmol/L (ref 22–32)
Chloride: 104 mmol/L (ref 98–111)
Creatinine, Ser: 1.01 mg/dL (ref 0.61–1.24)
GFR calc Af Amer: >60 mL/min (ref >60)
GLUCOSE: 152 mg/dL — AB (ref 70–99)
Potassium: 4.2 mmol/L (ref 3.5–5.1)
Sodium: 138 mmol/L (ref 135–145)

## 2018-03-03 NOTE — Evaluation (Signed)
Physical Therapy Evaluation Patient Details Name: Brendan Rose MRN: [id number] DOB: 1921-10-21 Today's Date: 03/03/2018   History of Present Illness  Patient is a 84 year old male lives at [facility] history of  hypertension gout, hearing deficit presented with a fall  on 02/27/18.  Patient had been unsteady on his feet and had several falls in the last week.   CT head showed multiple cerebellar masses,  also has a large mass in the RLL, mediastinal adenopathy, and concerns for metastatic disease to the liver.   Clinical Impression  The patient  Is in recliner. Assisted to ambulate x 20' with RW with decreased balance.  Noted palliative meeting today. Patient did state" I want to go home to die". Will follow along after Palliative meeting to determine PT intervention. indication.     Follow Up Recommendations (unsure- palliative mtg 9/18)    Equipment Recommendations  None recommended by PT    Recommendations for Other Services       Precautions / Restrictions Precautions Precautions: Fall      Mobility  Bed Mobility               General bed mobility comments: in recliner  Transfers Overall transfer level: Needs assistance Equipment used: Rolling walker (2 wheeled) Transfers: Sit to/from Stand Sit to Stand: Mod assist         General transfer comment: steady assist to rise from recliner and stabilize x 2 trials  Ambulation/Gait Ambulation/Gait assistance: Min assist Gait Distance (Feet): 20 Feet Assistive device: Rolling walker (2 wheeled) Gait Pattern/deviations: Step-through pattern;Shuffle     General Gait Details: gait is unsteady but able to ambulate  with min assistance.  Stairs            Wheelchair Mobility    Modified Rankin (Stroke Patients Only)       Balance Overall balance assessment: Needs assistance;History of Falls Sitting-balance support: Feet supported;Bilateral upper extremity supported Sitting balance-Leahy  Scale: Fair     Standing balance support: Bilateral upper extremity supported;During functional activity Standing balance-Leahy Scale: Poor                               Pertinent Vitals/Pain      Home Living Family/patient expects to be discharged to:: Assisted living               Home Equipment: Gilford Rile - 2 wheels      Prior Function           Comments: RW at baseline     Hand Dominance        Extremity/Trunk Assessment   Upper Extremity Assessment Upper Extremity Assessment: Generalized weakness    Lower Extremity Assessment Lower Extremity Assessment: Generalized weakness    Cervical / Trunk Assessment Cervical / Trunk Assessment: Kyphotic  Communication   Communication: HOH  Cognition Arousal/Alertness: Awake/alert Behavior During Therapy: Anxious Overall Cognitive Status: No family/caregiver present to determine baseline cognitive functioning                                 General Comments: oriented to hospital, not place nor date, follows commands, gets a little agitated. Stated" Let me go home to [employer] and states that he has brain cancer      General Comments      Exercises     Assessment/Plan    PT Assessment Patient  needs continued PT services(unless moves to Hospice care)  PT Problem List Decreased strength;Decreased cognition;Decreased range of motion;Decreased knowledge of use of DME;Decreased activity tolerance;Decreased safety awareness       PT Treatment Interventions DME instruction;Gait training;Functional mobility training;Therapeutic activities;Patient/family education    PT Goals (Current goals can be found in the Care Plan section)  Acute Rehab PT Goals Patient Stated Goal: to go home to die.  PT Goal Formulation: Patient unable to participate in goal setting Time For Goal Achievement: 03/17/18    Frequency Min 2X/week   Barriers to discharge        Co-evaluation                AM-PAC PT "6 Clicks" Daily Activity  Outcome Measure Difficulty turning over in bed (including adjusting bedclothes, sheets and blankets)?: Unable Difficulty moving from lying on back to sitting on the side of the bed? : Unable Difficulty sitting down on and standing up from a chair with arms (e.g., wheelchair, bedside commode, etc,.)?: Unable Help needed moving to and from a bed to chair (including a wheelchair)?: Total Help needed walking in hospital room?: Total Help needed climbing 3-5 steps with a railing? : Total 6 Click Score: 6    End of Session Equipment Utilized During Treatment: Gait belt Activity Tolerance: Patient tolerated treatment well Patient left: in chair;with call bell/phone within reach;with chair alarm set Nurse Communication: Mobility status PT Visit Diagnosis: Unsteadiness on feet (R26.81);History of falling (Z91.81)    Time: 2831-5176 PT Time Calculation (min) (ACUTE ONLY): 12 min   Charges:   PT Evaluation $PT Eval Low Complexity: [place] PT Acute Rehabilitation Services Pager [phone] Office [phone]   Claretha Cooper 03/03/2018, 12:43 PM

## 2018-03-03 NOTE — Progress Notes (Signed)
Physical therapy Note- Patient  May benefit from a short term rehab in SNF setting to further assess gait stability and safety. Patient was able to ambulate with PT today and gait was not significantly altered while using RW and 1 assist.. Spoke with Wadie Lessen, NP. Continue PT.Tequita Marrs PT Acute Rehabilitation Services Pager [phone] Office [phone]

## 2018-03-03 NOTE — Consult Note (Signed)
Consultation Note Date: 03/03/2018   Patient Name: Brendan Rose  DOB: 08/10/1921  MRN: [id number]  Age / Sex: 84 y.o., male  PCP: Cathlean Sauer, MD Referring Physician: Cristal Ford, DO  Reason for Consultation: Establishing goals of care and Psychosocial/spiritual support  HPI/Patient Profile: 84 y.o. male   admitted on 02/27/2018 with a PMH of  hypertension gout, hearing deficit presented with a fall at home.    Patient had been unsteady on his feet and had several falls in the last week.  Prior to admission, went to the bathroom and stood up and fell.    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    Clinical Assessment and Goals of Care:   This NP Wadie Lessen reviewed medical records, received report from team, assessed the patient and then meet at the patient's bedside along with his daughter Brendan Rose, son/ Brendan Rose and his wife  to discuss diagnosis, prognosis, GOC, EOL wishes disposition and options.  Family understand the seriousness of the situation and the long term poor prognosis.  Patient at times can speak to his cancer diagnosis and his readiness to die. He is intermittently confused and has poor insight into his care needs.  A detailed discussion was had today regarding advanced directives.  Concepts specific to code status, artifical feeding and hydration, continued IV  antibiotics and rehospitalization was had.  The difference between a aggressive medical intervention path  and a palliative comfort care path for this patient at this time was had.  Values and goals of care important to patient and family were attempted to be elicited.  Family and patient (he has a living will)do not wish any further workup or treatment for his cancer diagnosis.  They hope for a natural death.  However they are hopeful for a trail of short term rehab to see if he can become anymore "sturdy on his feet" before taking him home or having him place.  MOST form introduded  Natural trajectory and expectations at EOL were discussed.  Questions and concerns addressed.   Family encouraged to call with questions or concerns.    PMT will continue to support holistically.    No documented HPOA.  Wife is his legal surrogate and all family members support Brendan Rose/daughter as "second main decision maker"    SUMMARY OF RECOMMENDATIONS    Code Status/Advance Care Planning:  DNR    Symptom Management:   Weakness: PT to evaluate and make recommendation for rehab  Palliative Prophylaxis:   Aspiration, Bowel Regimen, Delirium Protocol, Frequent Pain Assessment and Oral  Care  Additional Recommendations (Limitations, Scope, Preferences):  Full Comfort Care  Psycho-social/Spiritual:   Desire for further Chaplaincy support:yes  Additional Recommendations: Education on Hospice   Emotional support for family. Created space and opportunity for family members to share their thoughts and feeling regarding this difficult situation.  Prognosis:   < than 6 months  Discharge Planning: To Be Determined      Primary Diagnoses: Present on Admission: . Essential hypertension . Gout   I have reviewed the medical record, interviewed the patient and family, and examined the patient. The following aspects are pertinent.  Past Medical History:  Diagnosis Date  . Arthritis     . Chronic back pain   . COPD (chronic obstructive pulmonary disease) ([place])   . Depression   . Enlarged prostate   . History of gout   . History of intracerebral hemorrhage without residual deficit   . History of syncope   . Hypertension   . Macular degeneration    Social History   Socioeconomic History  . Marital status: Married    Spouse name: Not on file  . Number of children: Not on file  . Years of education: Not on file  . Highest education level: Not on file  Occupational History  . Not on file  Social Needs  . Financial resource strain: Not on file  . Food insecurity:    Worry: Not on file    Inability: Not on file  . Transportation needs:    Medical: Not on file    Non-medical: Not on file  Tobacco Use  . Smoking status: Former Smoker    Types: Cigarettes  . Smokeless tobacco: Never Used  Substance and Sexual Activity  . Alcohol use: Yes    Alcohol/week: 2.0 standard drinks    Types: 2 Shots of liquor per week  . Drug use: Not Currently  . Sexual activity: Not Currently  Lifestyle  . Physical activity:    Days per week: Not on file    Minutes per session: Not on file  . Stress: Not on file  Relationships  . Social connections:    Talks on phone: Not on file    Gets together: Not on file    Attends religious service: Not on file    Active member of club or organization: Not on file    Attends meetings of clubs or organizations: Not on file    Relationship status: Not on file  Other Topics Concern  . Not on file  Social History Narrative  . Not on file   History reviewed. No pertinent family history. Scheduled Meds: . brimonidine  1 drop Both Eyes Daily   And  . timolol  1 drop Both Eyes Daily  . dexamethasone  8 mg Intravenous Q12H  . furosemide  40 mg Oral Daily  . heparin  5,000 Units Subcutaneous Q8H  . pantoprazole  40 mg Oral Daily  . sertraline  50 mg Oral Daily  . sodium chloride flush  3 mL Intravenous Q12H   Continuous  Infusions: . sodium chloride    . sodium chloride 75 mL/hr at 03/03/18 0412   PRN Meds:.sodium chloride, hydrALAZINE, iohexol, iopamidol, ondansetron (ZOFRAN) IV, sodium chloride flush Medications Prior to Admission:  Prior to Admission medications   Medication Sig Start Date End Date Taking? Authorizing Provider  albuterol (PROVENTIL) (2.5 MG/3ML) 0.083% nebulizer solution Take 2.5 mg by nebulization every 6 (six) hours as needed for wheezing or shortness of breath.   Yes  [provider]  Albuterol Sulfate (PROAIR RESPICLICK) 997 (90 Base) MCG/ACT AEPB Inhale 1-2 puffs into the lungs every 6 (six) hours as needed (for shortness of breath or wheezing).   Yes [provider]  b complex vitamins tablet Take 1 tablet by mouth daily.   Yes [provider]  brimonidine-timolol (COMBIGAN) 0.2-0.5 % ophthalmic solution Place 1 drop into both eyes daily.   Yes [provider]  Carboxymethylcellul-Glycerin (LUBRICATING EYE DROPS OP) Place 1-2 drops into both eyes 2 (two) times daily as needed (for dry eyes).   Yes [provider]  erythromycin ophthalmic ointment Place 1 application into both eyes every 4 (four) hours as needed (for macular degeneration).   Yes [provider]  fluocinonide cream (LIDEX) 7.41 % Apply 1 application topically 2 (two) times daily as needed (for itching).   Yes [provider]  Fluticasone Furoate (ARNUITY ELLIPTA) 100 MCG/ACT AEPB Inhale 1 puff into the lungs every morning.   Yes [provider]  furosemide (LASIX) 20 MG tablet Take 20 mg by mouth daily.   Yes [provider]  Multiple Vitamins-Minerals (OCUVITE PO) Take 1 tablet by mouth daily.   Yes [provider]  omeprazole (PRILOSEC) 40 MG capsule Take 40 mg by mouth at bedtime.   Yes [provider]  sertraline (ZOLOFT) 50 MG tablet Take 50 mg by mouth daily.    Yes [provider]   Allergies  Allergen  Reactions  . Allopurinol Other (See Comments)    Unknown  . Sulfa Antibiotics Rash   Review of Systems  Neurological: Positive for weakness.    Physical Exam  Constitutional:  -frail elderly man  Cardiovascular: Normal rate, regular rhythm and normal heart sounds.  Pulmonary/Chest: He has decreased breath sounds.  Musculoskeletal:  -generalized weakness  Skin: Skin is warm and dry. Bruising noted.    Vital Signs: BP (!) 160/67 (BP Location: Right Arm)   Pulse (!) 56   Temp 98 F (36.7 C) (Oral)   Resp 18   Ht 5\' 6"  (1.676 m)   Wt 68.6 kg   SpO2 98%   BMI 24.42 kg/m  Pain Scale: 0-10   Pain Score: 2    SpO2: SpO2: 98 % O2 Device:SpO2: 98 % O2 Flow Rate: .   IO: Intake/output summary:   Intake/Output Summary (Last 24 hours) at 03/03/2018 0809 Last data filed at 03/03/2018 0441 Gross per 24 hour  Intake 3229.75 ml  Output 1200 ml  Net 2029.75 ml    LBM: Last BM Date: 02/28/18 Baseline Weight: Weight: 68.6 kg Most recent weight: Weight: 68.6 kg     Palliative Assessment/Data: 50 % at best   Discussed with Dr Ree Kida  Time In: 1100 Time Out: 1230 Time Total: 90 minutes Greater than 50%  of this time was spent counseling and coordinating care related to the above assessment and plan.  Signed by: Wadie Lessen, NP   Please contact Palliative Medicine Team phone at [phone] for questions and concerns.  For individual provider: See Shea Evans

## 2018-03-03 NOTE — Progress Notes (Signed)
PROGRESS NOTE    [name]  FGH:[id number] DOB: [birth year] DOA: 02/27/2018 PCP: Cathlean Sauer, MD   Brief Narrative:  HPI on 02/27/2018 by Dr. Laurey Arrow  Brendan Rose is a 84 y.o. male with medical history significant for htn, gout, presbycusis, presents w/ above.  Patient reports that for about a week has been unsteady on his feet and has had several falls. Shortly before arrival tonight went to bathroom and when stood up fell, family reports brief LOC, was conscious when went to him. Unwitnessed. Denies head pain or pain elsewhere. Currently feeling his normal self. No trouble speaking or swallowing. No focal weakness/numbness. No dysuria or fevers. 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 Quintella Reichert who felt chemotherapy or other intervention would not be curative at this time and recommended discussing radiation oncology for possible palliative radiation -Radiation oncology consulted and appreciated and recommended palliative approach -This was discussed with patient's daughter who also agreed with palliative management and not interested in aggressive interventions -Palliative care consulted and appreciated -Family feeling that patient may be candidate for SNF -PT consulted and recommended SNF, social work consulted  Essential hypertension -Stable, continue Lasix, IV hydralazine as needed  GERD -Continue  PPI  Depression/anxiety -Continue sertraline  Hearing deficit -Stable  DVT Prophylaxis  heparin  Code Status: DNR  Family Communication: None at bedside  Disposition Plan: Admitted. Pending SNF placement  Consultants Palliative care Oncology Radiation oncology  Procedures  None  Antibiotics   Anti-infectives (From admission, onward)   Start     Dose/Rate Route Frequency Ordered Stop   02/27/18 2130  cefTRIAXone (ROCEPHIN) 1 g in sodium chloride 0.9 % 100 mL IVPB     1 g 200 mL/hr over 30 Minutes Intravenous  Once 02/27/18 2121 02/27/18 2228      Subjective:   Brendan Rose seen and examined today.  Patient is upset with being in the hospital would like to go home.  Denies current chest pain, shortness breath, abdominal pain, nausea vomiting, diarrhea constipation.  Objective:   Vitals:   03/02/18 1320 03/02/18 2043 03/03/18 0435 03/03/18 1337  BP: (!) 148/53 (!) 149/61 (!) 160/67 (!) 169/68  Pulse: 72 (!) 57 (!) 56 (!) 50  Resp: 16 18 18 16   Temp: 98.3 F (36.8 C) 98.2 F (36.8 C) 98 F (36.7 C) 97.7 F (36.5 C)  TempSrc: Oral Oral Oral Oral  SpO2: 95% 95% 98% 98%  Weight:      Height:        Intake/Output Summary (Last 24 hours) at 03/03/2018 1530 Last data filed at 03/03/2018 1500 Gross per 24 hour  Intake 4159.42 ml  Output 1000 ml  Net 3159.42 ml   Filed Weights   02/27/18 2318  Weight: 68.6 kg    Exam  General: Well developed, well nourished, elderly, NAD  HEENT: NCAT, mucous membranes moist.   Neck: Supple  Cardiovascular: S1 S2 auscultated, no rubs, murmurs or gallops. Regular rate and rhythm.  Respiratory: Clear to auscultation  bilaterally with equal chest rise  Abdomen: Soft, nontender, nondistended, + bowel sounds  Extremities: warm dry without cyanosis clubbing or edema  Neuro: AAOx3, hard of hearing, nonfocal  Psych: Normal affect and demeanor    Data Reviewed: I have personally reviewed following labs and imaging  studies  CBC: Recent Labs  Lab 02/27/18 1950 03/01/18 0457 03/02/18 0331 03/03/18 0337  WBC 6.9 9.3 8.1 8.4  HGB 13.8 15.2 13.4 13.5  HCT 41.9 45.5 39.6 41.9  MCV 93.9 94.0 92.3 94.8  PLT 238 274 244 102   Basic Metabolic Panel: Recent Labs  Lab 02/27/18 1950 03/01/18 0457 03/02/18 0331 03/03/18 0337  NA 141 139 136 138  K 3.9 3.8 3.7 4.2  CL 101 97* 101 104  CO2 28 31 26 26   GLUCOSE 113* 133* 134* 152*  BUN 22 30* 36* 34*  CREATININE 1.12 0.94 1.12 1.01  CALCIUM 9.2 9.8 8.9 8.5*   GFR: Estimated Creatinine Clearance: 41.2 mL/min (by C-G formula based on SCr of 1.01 mg/dL). Liver Function Tests: Recent Labs  Lab 02/27/18 1950  AST 23  ALT 15  ALKPHOS 61  BILITOT 0.9  PROT 7.0  ALBUMIN 3.8   No results for input(s): LIPASE, AMYLASE in the last 168 hours. No results for input(s): AMMONIA in the last 168 hours. Coagulation Profile: No results for input(s): INR, PROTIME in the last 168 hours. Cardiac Enzymes: No results for input(s): CKTOTAL, CKMB, CKMBINDEX, TROPONINI in the last 168 hours. BNP (last 3 results) No results for input(s): PROBNP in the last 8760 hours. HbA1C: No results for input(s): HGBA1C in the last 72 hours. CBG: No results for input(s): GLUCAP in the last 168 hours. Lipid Profile: No results for input(s): CHOL, HDL, LDLCALC, TRIG, CHOLHDL, LDLDIRECT in the last 72 hours. Thyroid Function Tests: No results for input(s): TSH, T4TOTAL, FREET4, T3FREE, THYROIDAB in the last 72 hours. Anemia Panel: No results for input(s): VITAMINB12, FOLATE, FERRITIN, TIBC, IRON, RETICCTPCT in the last 72 hours. Urine analysis:    Component Value Date/Time   COLORURINE YELLOW 02/27/2018 1925   APPEARANCEUR HAZY (A) 02/27/2018 1925   LABSPEC 1.014 02/27/2018 1925   PHURINE 6.0 02/27/2018 1925   GLUCOSEU NEGATIVE 02/27/2018 1925   HGBUR NEGATIVE 02/27/2018 1925   BILIRUBINUR NEGATIVE 02/27/2018 1925   KETONESUR NEGATIVE 02/27/2018 1925   PROTEINUR  NEGATIVE 02/27/2018 1925   NITRITE NEGATIVE 02/27/2018 1925   LEUKOCYTESUR SMALL (A) 02/27/2018 1925   Sepsis Labs: @LABRCNTIP (procalcitonin:4,lacticidven:4)  ) Recent Results (from the past 240 hour(s))  Urine Culture     Status: Abnormal   Collection Time: 02/27/18  7:25 PM  Result Value Ref Range Status   Specimen Description   Final    URINE, RANDOM Performed at [facility], [place] [address] [place], [place] [address]    Special Requests   Final    NONE Performed at [facility], [place] [address], [place], [place] [address]    Culture MULTIPLE SPECIES PRESENT, SUGGEST RECOLLECTION (A)  Final   Report Status 03/01/2018 FINAL  Final      Radiology Studies: No results found.   Scheduled Meds: . brimonidine  1 drop Both Eyes Daily   And  . timolol  1 drop Both Eyes Daily  . dexamethasone  8 mg Intravenous Q12H  . furosemide  40 mg Oral Daily  . heparin  5,000 Units Subcutaneous Q8H  . pantoprazole  40 mg Oral Daily  . sertraline  50 mg Oral Daily  .  sodium chloride flush  3 mL Intravenous Q12H   Continuous Infusions: . sodium chloride    . sodium chloride 75 mL/hr at 03/03/18 0412     LOS: 4 days   Time Spent in minutes   30 minutes  Rayni Nemitz D.O. on 03/03/2018 at 3:30 PM  Between 7am to 7pm - Please see pager noted on [email or web address]  After 7pm go to [email or web address]  And look for the night coverage person covering for me after hours  [facility] Office  [phone]

## 2018-03-04 DIAGNOSIS — M109 Gout, unspecified: Secondary | ICD-10-CM

## 2018-03-04 DIAGNOSIS — Z515 Encounter for palliative care: Secondary | ICD-10-CM

## 2018-03-04 MED ORDER — ROPINIROLE HCL 1 MG PO TABS
1.0000 mg | ORAL_TABLET | Freq: Once | ORAL | Status: AC
  Administered 2018-03-04: 1 mg via ORAL
  Filled 2018-03-04: qty 1

## 2018-03-04 NOTE — NC FL2 (Signed)
[place] LEVEL OF CARE SCREENING TOOL     IDENTIFICATION  Patient Name: Brendan Rose Birthdate: 07/05/1921 Sex: male Admission Date (Current Location): 02/27/2018  [facility] and [place] Number:  Herbalist and Address:  [facility],  [place] [address] [place]      Provider Number: [id number]  Attending Physician Name and Address:  Cristal Ford, DO  Relative Name and Phone Number:       Current Level of Care: Hospital Recommended Level of Care: [place] Prior Approval Number:    Date Approved/Denied:   PASRR Number: [id number]  Discharge Plan: SNF    Current Diagnoses: Patient Active Problem List   Diagnosis Date Noted  . Palliative care by specialist   . DNR (do not resuscitate)   . Weakness generalized   . Brain mass 02/27/2018  . Lung nodule 02/27/2018  . Ataxia 02/27/2018  . Fall at home, initial encounter 02/27/2018  . Presbycusis of both ears 02/27/2018  . Right shoulder pain 06/08/2014  . PAC (premature atrial contraction) 05/05/2014  . Supraventricular premature beats 05/05/2014  . Syncope and collapse 05/05/2014  . Nontraumatic intracerebral hemorrhage ([place]) 05/04/2014  . Subcoracoid bursitis 10/12/2013  . Hereditary and idiopathic peripheral neuropathy 10/12/2013  . Essential hypertension 05/10/2013  . Impacted cerumen 05/10/2013  . Lumbosacral spondylosis without myelopathy 05/10/2013  . Hearing loss 05/10/2013  . Macular degeneration (senile) of retina 05/10/2013  . Gout 11/22/2012  . Overweight 11/22/2012  . Hypertrophy of breast 11/22/2012  . Abdominal pain 11/20/2012  . Chest pain 11/20/2012  . Gait disturbance 11/20/2012  . Allergic rhinitis 11/20/2012  . Benign enlargement of prostate 11/20/2012  . Cervical spondylosis without myelopathy 11/20/2012  . Other emphysema ([place]) 11/20/2012  . Major depressive disorder, single episode 11/20/2012  . Exudative age-related  macular degeneration ([place]) 11/05/2012  . Neovascularization, retina 11/05/2012  . Other allergy status, other than to drugs and biological substances 09/15/2012  . Backache 06/03/2011    Orientation RESPIRATION BLADDER Height & Weight     Self  Normal Incontinent Weight: 151 lb 4.8 oz (68.6 kg) Height:  5\' 6"  (167.6 cm)  BEHAVIORAL SYMPTOMS/MOOD NEUROLOGICAL BOWEL NUTRITION STATUS      Continent Diet(regular diet)  AMBULATORY STATUS COMMUNICATION OF NEEDS Skin   Extensive Assist Verbally Normal                       Personal Care Assistance Level of Assistance  Bathing, Feeding, Dressing Bathing Assistance: Maximum assistance Feeding assistance: Independent Dressing Assistance: Maximum assistance     Functional Limitations Info  Sight, Hearing, Speech Sight Info: Adequate Hearing Info: Adequate Speech Info: Adequate    SPECIAL CARE FACTORS FREQUENCY  PT (By licensed PT), OT (By licensed OT)     PT Frequency: 5x OT Frequency: 5x            Contractures Contractures Info: Not present    Additional Factors Info  Code Status, Allergies Code Status Info: DNR Allergies Info: Allopurinol, Sulfa Antibiotics           Current Medications (03/04/2018):  This is the current hospital active medication list Current Facility-Administered Medications  Medication Dose Route Frequency Provider Last Rate Last Dose  . 0.9 %  sodium chloride infusion  250 mL Intravenous PRN Wouk, Ailene Rud, MD      . 0.9 %  sodium chloride infusion   Intravenous Continuous Rai, Ripudeep K, MD 75 mL/hr at 03/04/18 [phone]    .  brimonidine (ALPHAGAN) 0.2 % ophthalmic solution 1 drop  1 drop Both Eyes Daily Rai, Ripudeep K, MD   1 drop at 03/04/18 1126   And  . timolol (TIMOPTIC) 0.5 % ophthalmic solution 1 drop  1 drop Both Eyes Daily Rai, Ripudeep K, MD   1 drop at 03/04/18 1126  . dexamethasone (DECADRON) injection 8 mg  8 mg Intravenous Q12H Wouk, Ailene Rud, MD   8 mg at 03/04/18 1125   . furosemide (LASIX) tablet 40 mg  40 mg Oral Daily Gwynne Edinger, MD   40 mg at 03/04/18 1127  . heparin injection 5,000 Units  5,000 Units Subcutaneous Q8H Gwynne Edinger, MD   5,000 Units at 03/04/18 0548  . hydrALAZINE (APRESOLINE) injection 10 mg  10 mg Intravenous Q6H PRN Rai, Ripudeep K, MD   10 mg at 03/01/18 1359  . iohexol (OMNIPAQUE) 300 MG/ML solution 30 mL  30 mL Intravenous Once PRN Rai, Ripudeep K, MD      . iopamidol (ISOVUE-300) 61 % injection 15 mL  15 mL Oral Once PRN Rai, Ripudeep K, MD      . ondansetron (ZOFRAN) injection 4 mg  4 mg Intravenous Q6H PRN Rai, Ripudeep K, MD      . pantoprazole (PROTONIX) EC tablet 40 mg  40 mg Oral Daily Gwynne Edinger, MD   40 mg at 03/04/18 1126  . sertraline (ZOLOFT) tablet 50 mg  50 mg Oral Daily Gwynne Edinger, MD   50 mg at 03/04/18 1125  . sodium chloride flush (NS) 0.9 % injection 3 mL  3 mL Intravenous Q12H Wouk, Ailene Rud, MD   3 mL at 03/01/18 1037  . sodium chloride flush (NS) 0.9 % injection 3 mL  3 mL Intravenous PRN Wouk, Ailene Rud, MD         Discharge Medications: Please see discharge summary for a list of discharge medications.  Relevant Imaging Results:  Relevant Lab Results:   Additional Information SS# [id number]  Nila Nephew, LCSW

## 2018-03-04 NOTE — Progress Notes (Signed)
Patient ID: NAZAR KUAN, male   DOB: 08-28-1921, 84 y.o.   MRN: [id number]  This NP visited patient at the bedside as a follow up to  yesterday's [place].  Patient is out of bed to the chair, he appears comfortable.  No family at bedside.  Placed call to daughter Leonard Downing, as follow-up to yesterday's goals of care meeting. Family understand the long-term poor prognosis and ultimately comfort is the main focus of care.  However they would like to investigate the possibility   for short-term rehab hoping  patient will regain some strength.  Discussed with social work and various facilities are being considered.  Discussed with family the importance of continued conversation with  their  medical providers regarding overall plan of care and treatment options,  ensuring decisions are within the context of the patients values and GOCs.  Recommend palliative service to follow on discharge  Daughter verbalize some concerns with her father's overall hygiene.  Share this information with the charge nurse.  Questions and concerns addressed   Discussed with Dr Roylene Reason  Total time spent on the unit was 35 minutes  Greater than 50% of the time was spent in counseling and coordination of care  Wadie Lessen NP  Palliative Medicine Team Team Phone # [phone] Pager [phone]

## 2018-03-04 NOTE — Discharge Summary (Addendum)
Physician Discharge Summary  Brendan Rose CXK:[id number] DOB: 1922/03/29 DOA: 02/27/2018  PCP: Brendan Sauer, MD  Admit date: 02/27/2018 Discharge date: 03/05/2018  Time spent: 45 minutes  Recommendations for Outpatient Follow-up:  Patient will be discharged to skilled nursing facility with palliative care to follow. Continue physical and occupational therapy.  Patient will need to follow up with primary care provider within one week of discharge.  Patient should continue medications as prescribed.  Patient should follow a regular diet.   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  Patient reports that for about a week has been unsteady on his feet and has had several falls. Shortly before arrival tonight went to bathroom and when stood up fell, family reports brief LOC, was conscious when went to him. Unwitnessed. Denies head pain or pain elsewhere. Currently feeling his normal self. No trouble speaking or swallowing. No focal weakness/numbness. No dysuria or fevers.  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 with pleural spread of tumor 1.1 cm, right hilar adenopathy, metastatic  lesion in the right lobe of the liver -Findings were discussed with oncology, Brendan Rose who felt chemotherapy or other intervention would not be curative at this time and recommended discussing radiation oncology for possible palliative radiation -Radiation oncology consulted and appreciated and recommended palliative approach -This was discussed with patient's daughter who also agreed with palliative management and not interested in aggressive interventions -Palliative care consulted and appreciated -Family feeling that patient may be candidate for SNF -PT consulted and recommended SNF, social work consulted -patient placed steroids (decadron)- however may add to patient's agitation. Discussed with family, will not continue on discharge  Essential hypertension -Stable, continue Lasix  GERD -Continue PPI  Depression/anxiety -Continue sertraline  Hearing deficit -Stable  Macular degeneration -continue eye drops for comfort   Consultants Palliative care Oncology Radiation oncology  Procedures  None  Code status: DNR  Discharge Exam: Vitals:   03/04/18 1950 03/05/18 0455  BP: (!) 142/70 140/72  Pulse: 67 74  Resp: 16 18  Temp: 97.6 F (36.4 C) 97.7 F (36.5 C)  SpO2: 97% 98%     General: Well developed, well nourished, elderly, NAD  HEENT: NCAT,  mucous membranes moist.  Neck: Supple  Cardiovascular: S1 S2 auscultated, RRR  Respiratory: Clear to auscultation bilaterally with equal chest rise  Abdomen: Soft, nontender, nondistended, + bowel sounds  Extremities: warm dry without cyanosis clubbing or edema  Discharge Instructions Discharge Instructions    Discharge instructions   Complete by:  As directed    Patient will be discharged to skilled nursing facility with palliative care to follow. Continue physical and occupational therapy.  Patient will need to follow up with primary care provider within one week of discharge.  Patient should continue  medications as prescribed.  Patient should  follow a regular diet.     Allergies as of 03/05/2018      Reactions   Allopurinol Other (See Comments)   Unknown   Sulfa Antibiotics Rash      Medication List    STOP taking these medications   ARNUITY ELLIPTA 100 MCG/ACT Aepb Generic drug:  Fluticasone Furoate   b complex vitamins tablet     TAKE these medications   albuterol (2.5 MG/3ML) 0.083% nebulizer solution Commonly known as:  PROVENTIL Take 2.5 mg by nebulization every 6 (six) hours as needed for wheezing or shortness of breath.   PROAIR RESPICLICK 967 (90 Base) MCG/ACT Aepb Generic drug:  Albuterol Sulfate Inhale 1-2 puffs into the lungs every 6 (six) hours as needed (for shortness of breath or wheezing).   COMBIGAN 0.2-0.5 % ophthalmic solution Generic drug:  brimonidine-timolol Place 1 drop into both eyes daily.   erythromycin ophthalmic ointment Place 1 application into both eyes every 4 (four) hours as needed (for macular degeneration).   fluocinonide cream 0.05 % Commonly known as:  LIDEX Apply 1 application topically 2 (two) times daily as needed (for itching).   furosemide 20 MG tablet Commonly known as:  LASIX Take 20 mg by mouth daily.   LUBRICATING EYE DROPS OP Place 1-2 drops into both eyes 2 (two) times daily as needed (for dry eyes).   OCUVITE PO Take 1 tablet by mouth daily.   omeprazole 40 MG capsule Commonly known as:  PRILOSEC Take 40 mg by mouth at bedtime.   sertraline 50 MG tablet Commonly known as:  ZOLOFT Take 50 mg by mouth daily.      Allergies  Allergen Reactions  . Allopurinol Other (See Comments)    Unknown  . Sulfa Antibiotics Rash   Follow-up Information    Brendan Sauer, MD. Schedule an appointment as soon as possible for a visit in 1 week(s).   Specialty:  Family Medicine Why:  Hospital follow up Contact information: [address] [place] [address] [phone]            The results of  significant diagnostics from this hospitalization (including imaging, microbiology, ancillary and laboratory) are listed below for reference.    Significant Diagnostic Studies: Dg Chest 2 View  Result Date: 02/27/2018 CLINICAL DATA:  Pain after fall. EXAM: CHEST - 2 VIEW COMPARISON:  Radiograph 12/25/2017, CT 12/31/2017 FINDINGS: Again seen elevation of right hemidiaphragm. Masslike opacity in the periphery of the right lower lobe is grossly similar less well-defined currently. No pneumothorax. No new focal airspace disease, pleural effusion or pulmonary edema. Mild peripheral scarring in the right midlung. No acute osseous abnormalities are seen. Chronic change about the right acromioclavicular joint. IMPRESSION: 1. No acute or traumatic findings. 2. Unchanged elevation of right hemidiaphragm. Persistent rounded opacity in the periphery of the right lower lobe, masslike opacity on prior chest CT. Given persistence, neoplastic process is favored. Follow-up chest CT is already scheduled. Electronically Signed   By: Keith Rake M.D.   On: 02/27/2018 21:03   Dg Lumbar Spine Complete  Result Date: 02/27/2018 CLINICAL DATA:  Initial encounter for Pt is presented from [facility] for evaluation post fall suspected to be related to a near syncope episode per family report. EXAM: LUMBAR SPINE - COMPLETE 4+ VIEW COMPARISON:  None. FINDINGS: Five lumbar type vertebral bodies. Left hip arthroplasty. Sacroiliac joints are symmetric. Maintenance of vertebral body height and alignment. Spondylosis involves L3 through S1 with degenerative disc disease and facet arthropathy.  Aortic atherosclerosis. IMPRESSION: Spondylosis, without acute osseous abnormality. Aortic Atherosclerosis (ICD10-I70.0). Electronically Signed   By: Abigail Miyamoto M.D.   On: 02/27/2018 21:00   Ct Head Wo Contrast  Result Date: 02/27/2018 CLINICAL DATA:  Post fall with near syncopal episode. EXAM: CT HEAD WITHOUT CONTRAST TECHNIQUE:  Contiguous axial images were obtained from the base of the skull through the vertex without intravenous contrast. COMPARISON:  11/19/2016 FINDINGS: Brain: Lateral and third ventricles are within normal. There is mild impression on the fourth ventricle as there are multiple cerebellar masses with the largest measuring 4.3 cm over the left hemisphere. These masses have low-density center and likely due to metastatic disease. There is mild adjacent vasogenic edema. No definite supratentorial masses identified. No evidence of acute hemorrhage or midline shift. No acute infarction. Mild chronic ischemic microvascular disease. Vascular: No hyperdense vessel or unexpected calcification. Skull: Normal. Negative for fracture or focal lesion. Sinuses/Orbits: No acute finding. Other: None. IMPRESSION: No acute findings. Multiple cerebellar masses with mild adjacent edema and mild mass effect on the fourth ventricle. 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 Mild chronic ischemic microvascular disease. Electronically Signed   By: Marin Olp M.D.   On: 02/27/2018 20:06   Ct Chest W Contrast  Result Date: 03/01/2018 CLINICAL DATA:  84 year old male with ataxia and abnormal head CT with cerebellar masses raising possibly metastatic disease. Subsequent encounter. EXAM: CT CHEST, ABDOMEN, AND PELVIS WITH CONTRAST TECHNIQUE: Multidetector CT imaging of the chest, abdomen and pelvis was performed following the standard protocol during bolus administration of intravenous contrast. CONTRAST:  163mL OMNIPAQUE IOHEXOL 300 MG/ML  SOLN COMPARISON:  01/04/2018 and 08/06/2016 chest CT. FINDINGS: CT CHEST FINDINGS Cardiovascular: No central pulmonary embolus. No aortic dissection or aneurysm. Atherosclerotic changes aorta and great vessels. Heart top-normal. Coronary artery calcifications. Mediastinum/Nodes: Right hilar adenopathy measuring up to 3.5 cm. Subcarinal adenopathy with 2.5 cm short axis dimension. Lungs/Pleura: Elevated  right hemidiaphragm. Posterior right lower lobe pleural based 5.8 x 5.5 x 5.6 cm mass. This is highly suspicious for primary malignancy causing narrowing of supplying bronchi and vessels. There may be pleural spread of tumor as there is a 1 x 1.2 x 1.1 cm nodular opacity inferolateral to the mass. Baseline emphysema.  Trachea and mainstem bronchi are patent. Musculoskeletal: Degenerative changes lower cervical and thoracic spine without osseous destructive lesion. CT ABDOMEN PELVIS FINDINGS Hepatobiliary: In addition to liver cysts, suspect 1.1 cm metastatic lesion right lobe liver (series 2, image 45 and series 5, image 85). No calcified gallstone or CT evidence of inflammation. Pancreas: No worrisome pancreatic mass or inflammation. Spleen: No splenic mass or enlargement. Adrenals/Urinary Tract: No obstructing stone or hydronephrosis. Scattered renal lesions greater on left, larger ones which are cysts and others too small to characterize. No adrenal lesion. Limited evaluation urinary bladder secondary to lack of contrast and streak artifact from hip replacement. No gross abnormality noted. Stomach/Bowel: Diverticulosis most notable sigmoid colon. No evidence of diverticulitis. Under distended stomach without gross abnormality. Vascular/Lymphatic: Atherosclerotic changes aorta and aortic branch vessels. No abdominal aortic aneurysm or large vessel occlusion. Scattered normal size abdominal/pelvic lymph nodes. Reproductive: Evaluation limited by artifact from hip replacement. Other: No free air or bowel containing hernia. Musculoskeletal: Degenerative changes L3-4 and L4-5. Right hip joint degenerative changes. Prior left hip replacement. No osseous destructive lesion. IMPRESSION: 1. Posterior right lower lobe pleural based 5.8 x 5.5 x 5.6 cm mass. This is highly suspicious for primary malignancy. There may be pleural spread of tumor as there is  a 1 x 1.2 x 1.1 cm nodular opacity inferolateral to the mass. 2.   Emphysema (ICD10-J43.9). 3. Right hilar adenopathy measuring up to 3.5 cm. Subcarinal adenopathy with 2.5 cm short axis dimension. 4. Suspect 1.1 cm metastatic lesion within the right lobe of the liver (series 2, image 45 and series 5, image 85). 5. Sigmoid diverticulosis. 6.  Aortic Atherosclerosis (ICD10-I70.0). Electronically Signed   By: Genia Del M.D.   On: 03/01/2018 14:01   Ct Abdomen Pelvis W Contrast  Result Date: 03/01/2018 CLINICAL DATA:  84 year old male with ataxia and abnormal head CT with cerebellar masses raising possibly metastatic disease. Subsequent encounter. EXAM: CT CHEST, ABDOMEN, AND PELVIS WITH CONTRAST TECHNIQUE: Multidetector CT imaging of the chest, abdomen and pelvis was performed following the standard protocol during bolus administration of intravenous contrast. CONTRAST:  132mL OMNIPAQUE IOHEXOL 300 MG/ML  SOLN COMPARISON:  01/04/2018 and 08/06/2016 chest CT. FINDINGS: CT CHEST FINDINGS Cardiovascular: No central pulmonary embolus. No aortic dissection or aneurysm. Atherosclerotic changes aorta and great vessels. Heart top-normal. Coronary artery calcifications. Mediastinum/Nodes: Right hilar adenopathy measuring up to 3.5 cm. Subcarinal adenopathy with 2.5 cm short axis dimension. Lungs/Pleura: Elevated right hemidiaphragm. Posterior right lower lobe pleural based 5.8 x 5.5 x 5.6 cm mass. This is highly suspicious for primary malignancy causing narrowing of supplying bronchi and vessels. There may be pleural spread of tumor as there is a 1 x 1.2 x 1.1 cm nodular opacity inferolateral to the mass. Baseline emphysema.  Trachea and mainstem bronchi are patent. Musculoskeletal: Degenerative changes lower cervical and thoracic spine without osseous destructive lesion. CT ABDOMEN PELVIS FINDINGS Hepatobiliary: In addition to liver cysts, suspect 1.1 cm metastatic lesion right lobe liver (series 2, image 45 and series 5, image 85). No calcified gallstone or CT evidence of  inflammation. Pancreas: No worrisome pancreatic mass or inflammation. Spleen: No splenic mass or enlargement. Adrenals/Urinary Tract: No obstructing stone or hydronephrosis. Scattered renal lesions greater on left, larger ones which are cysts and others too small to characterize. No adrenal lesion. Limited evaluation urinary bladder secondary to lack of contrast and streak artifact from hip replacement. No gross abnormality noted. Stomach/Bowel: Diverticulosis most notable sigmoid colon. No evidence of diverticulitis. Under distended stomach without gross abnormality. Vascular/Lymphatic: Atherosclerotic changes aorta and aortic branch vessels. No abdominal aortic aneurysm or large vessel occlusion. Scattered normal size abdominal/pelvic lymph nodes. Reproductive: Evaluation limited by artifact from hip replacement. Other: No free air or bowel containing hernia. Musculoskeletal: Degenerative changes L3-4 and L4-5. Right hip joint degenerative changes. Prior left hip replacement. No osseous destructive lesion. IMPRESSION: 1. Posterior right lower lobe pleural based 5.8 x 5.5 x 5.6 cm mass. This is highly suspicious for primary malignancy. There may be pleural spread of tumor as there is a 1 x 1.2 x 1.1 cm nodular opacity inferolateral to the mass. 2.  Emphysema (ICD10-J43.9). 3. Right hilar adenopathy measuring up to 3.5 cm. Subcarinal adenopathy with 2.5 cm short axis dimension. 4. Suspect 1.1 cm metastatic lesion within the right lobe of the liver (series 2, image 45 and series 5, image 85). 5. Sigmoid diverticulosis. 6.  Aortic Atherosclerosis (ICD10-I70.0). Electronically Signed   By: Genia Del M.D.   On: 03/01/2018 14:01    Microbiology: Recent Results (from the past 240 hour(s))  Urine Culture     Status: Abnormal   Collection Time: 02/27/18  7:25 PM  Result Value Ref Range Status   Specimen Description   Final    URINE, RANDOM Performed at [facility]  [place] [address],  [place], [place] [address]    Special Requests   Final    NONE Performed at [facility], [place] [address], [place], [place] [address]    Culture MULTIPLE SPECIES PRESENT, SUGGEST RECOLLECTION (A)  Final   Report Status 03/01/2018 FINAL  Final     Labs: Basic Metabolic Panel: Recent Labs  Lab 02/27/18 1950 03/01/18 0457 03/02/18 0331 03/03/18 0337  NA 141 139 136 138  K 3.9 3.8 3.7 4.2  CL 101 97* 101 104  CO2 28 31 26 26   GLUCOSE 113* 133* 134* 152*  BUN 22 30* 36* 34*  CREATININE 1.12 0.94 1.12 1.01  CALCIUM 9.2 9.8 8.9 8.5*   Liver Function Tests: Recent Labs  Lab 02/27/18 1950  AST 23  ALT 15  ALKPHOS 61  BILITOT 0.9  PROT 7.0  ALBUMIN 3.8   No results for input(s): LIPASE, AMYLASE in the last 168 hours. No results for input(s): AMMONIA in the last 168 hours. CBC: Recent Labs  Lab 02/27/18 1950 03/01/18 0457 03/02/18 0331 03/03/18 0337  WBC 6.9 9.3 8.1 8.4  HGB 13.8 15.2 13.4 13.5  HCT 41.9 45.5 39.6 41.9  MCV 93.9 94.0 92.3 94.8  PLT 238 274 244 225   Cardiac Enzymes: No results for input(s): CKTOTAL, CKMB, CKMBINDEX, TROPONINI in the last 168 hours. BNP: BNP (last 3 results) No results for input(s): BNP in the last 8760 hours.  ProBNP (last 3 results) No results for input(s): PROBNP in the last 8760 hours.  CBG: No results for input(s): GLUCAP in the last 168 hours.     Signed:  Cristal Ford  [facility] 03/05/2018, 10:13 AM

## 2018-03-04 NOTE — Progress Notes (Signed)
PROGRESS NOTE    Brendan Rose  TKW:[id number] DOB: [birth year] DOA: 02/27/2018 PCP: Cathlean Sauer, MD   Brief Narrative:  HPI on 02/27/2018 by Dr. Laurey Arrow  Brendan Rose is a 84 y.o. male with medical history significant for htn, gout, presbycusis, presents w/ above.  Patient reports that for about a week has been unsteady on his feet and has had several falls. Shortly before arrival tonight went to bathroom and when stood up fell, family reports brief LOC, was conscious when went to him. Unwitnessed. Denies head pain or pain elsewhere. Currently feeling his normal self. No trouble speaking or swallowing. No focal weakness/numbness. No dysuria or fevers.  Interim history Admitted for ataxia and found to have cerebellar brain masses. Pending SNF. Palliative care consulted.  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 Quintella Reichert who felt chemotherapy or other intervention would not be curative at this time and recommended discussing radiation oncology for possible palliative radiation -Radiation oncology consulted and appreciated and recommended palliative approach -This was discussed with patient's daughter who also agreed with palliative management and not interested in aggressive interventions -Palliative care consulted and appreciated -Family feeling that patient may be candidate for SNF -PT consulted and recommended SNF, social work  consulted,  SNF pending   Essential hypertension -Stable, continue Lasix, IV hydralazine as needed  GERD -Continue PPI  Depression/anxiety -Continue sertraline  Hearing deficit -Stable  DVT Prophylaxis  heparin  Code Status: DNR  Family Communication: None at bedside  Disposition Plan: Admitted. Pending SNF placement  Consultants Palliative care Oncology Radiation oncology  Procedures  None  Antibiotics   Anti-infectives (From admission, onward)   Start     Dose/Rate Route Frequency Ordered Stop   02/27/18 2130  cefTRIAXone (ROCEPHIN) 1 g in sodium chloride 0.9 % 100 mL IVPB     1 g 200 mL/hr over 30 Minutes Intravenous  Once 02/27/18 2121 02/27/18 2228      Subjective:   Brendan Rose seen and examined today.  No complaints this morning. Would like to go home. Denies chest pain, shortness of breath.   Objective:   Vitals:   03/03/18 0435 03/03/18 1337 03/03/18 2149 03/04/18 0616  BP: (!) 160/67 (!) 169/68 138/64 (!) 157/84  Pulse: (!) 56 (!) 50 (!) 55 (!) 57  Resp: 18 16 16 17   Temp: 98 F (36.7 C) 97.7 F (36.5 C) 99 F (37.2 C) 98.9 F (37.2 C)  TempSrc: Oral Oral Oral Oral  SpO2: 98% 98% 95% 97%  Weight:      Height:        Intake/Output Summary (Last 24 hours) at 03/04/2018 1354 Last data filed at 03/04/2018 0930 Gross per 24 hour  Intake 3149.75 ml  Output 420 ml  Net 2729.75 ml   Filed Weights   02/27/18 2318  Weight: 68.6 kg   Exam  General: Well developed, well nourished, NAD, appears stated age  40: NCAT, mucous membranes moist.   Neuro: AAOx1 (self), nonfocal  Data Reviewed:  I have personally reviewed following labs and imaging studies  CBC: Recent Labs  Lab 02/27/18 1950 03/01/18 0457 03/02/18 0331 03/03/18 0337  WBC 6.9 9.3 8.1 8.4  HGB 13.8 15.2 13.4 13.5  HCT 41.9 45.5 39.6 41.9  MCV 93.9 94.0 92.3 94.8  PLT 238 274 244 093   Basic Metabolic Panel: Recent Labs  Lab 02/27/18 1950 03/01/18 0457  03/02/18 0331 03/03/18 0337  NA 141 139 136 138  K 3.9 3.8 3.7 4.2  CL 101 97* 101 104  CO2 28 31 26 26   GLUCOSE 113* 133* 134* 152*  BUN 22 30* 36* 34*  CREATININE 1.12 0.94 1.12 1.01  CALCIUM 9.2 9.8 8.9 8.5*   GFR: Estimated Creatinine Clearance: 41.2 mL/min (by C-G formula based on SCr of 1.01 mg/dL). Liver Function Tests: Recent Labs  Lab 02/27/18 1950  AST 23  ALT 15  ALKPHOS 61  BILITOT 0.9  PROT 7.0  ALBUMIN 3.8   No results for input(s): LIPASE, AMYLASE in the last 168 hours. No results for input(s): AMMONIA in the last 168 hours. Coagulation Profile: No results for input(s): INR, PROTIME in the last 168 hours. Cardiac Enzymes: No results for input(s): CKTOTAL, CKMB, CKMBINDEX, TROPONINI in the last 168 hours. BNP (last 3 results) No results for input(s): PROBNP in the last 8760 hours. HbA1C: No results for input(s): HGBA1C in the last 72 hours. CBG: No results for input(s): GLUCAP in the last 168 hours. Lipid Profile: No results for input(s): CHOL, HDL, LDLCALC, TRIG, CHOLHDL, LDLDIRECT in the last 72 hours. Thyroid Function Tests: No results for input(s): TSH, T4TOTAL, FREET4, T3FREE, THYROIDAB in the last 72 hours. Anemia Panel: No results for input(s): VITAMINB12, FOLATE, FERRITIN, TIBC, IRON, RETICCTPCT in the last 72 hours. Urine analysis:    Component Value Date/Time   COLORURINE YELLOW 02/27/2018 1925   APPEARANCEUR HAZY (A) 02/27/2018 1925   LABSPEC 1.014 02/27/2018 1925   PHURINE 6.0 02/27/2018 1925   GLUCOSEU NEGATIVE 02/27/2018 1925   HGBUR NEGATIVE 02/27/2018 1925   BILIRUBINUR NEGATIVE 02/27/2018 1925   KETONESUR NEGATIVE 02/27/2018 1925   PROTEINUR NEGATIVE 02/27/2018 1925   NITRITE NEGATIVE 02/27/2018 1925   LEUKOCYTESUR SMALL (A) 02/27/2018 1925   Sepsis Labs: @LABRCNTIP (procalcitonin:4,lacticidven:4)  ) Recent Results (from the past 240 hour(s))  Urine Culture     Status: Abnormal   Collection Time: 02/27/18  7:25 PM  Result  Value Ref Range Status   Specimen Description   Final    URINE, RANDOM Performed at [facility], [place] [address], [place], [place] [address]    Special Requests   Final    NONE Performed at [facility], [place] [address] [place], [place] [address]    Culture MULTIPLE SPECIES PRESENT, SUGGEST RECOLLECTION (A)  Final   Report Status 03/01/2018 FINAL  Final      Radiology Studies: No results found.   Scheduled Meds: . brimonidine  1 drop Both Eyes Daily   And  . timolol  1 drop Both Eyes Daily  . dexamethasone  8 mg Intravenous Q12H  . furosemide  40 mg Oral Daily  . heparin  5,000 Units Subcutaneous Q8H  . pantoprazole  40 mg Oral Daily  . sertraline  50 mg Oral Daily  . sodium chloride flush  3 mL Intravenous Q12H   Continuous Infusions: . sodium chloride    . sodium chloride 75 mL/hr at 03/04/18 0613     LOS: 5 days   Time Spent in minutes  30 minutes  Kevyn Boquet D.O. on 03/04/2018 at 1:54 PM  Between 7am to 7pm - Please see pager noted on [email or web address]  After 7pm go to [email or web address]  And look for the night coverage person covering for me after hours  [facility] Office  [phone]

## 2018-03-04 NOTE — Clinical Social Work Note (Signed)
Clinical Social Work Assessment  Patient Details  Name: Brendan Rose MRN: [id number] Date of Birth: [birth year]  Date of referral:  03/04/18               Reason for consult:  Facility Placement                Permission sought to share information with:  Family Supports Permission granted to share information::  Yes, Verbal Permission Granted  Name::     daughter Community education officer::     Relationship::     Contact Information:     Housing/Transportation Living arrangements for the past 2 months:  [place] of Information:  Adult Children, Medical Team Patient Interpreter Needed:  None Criminal Activity/Legal Involvement Pertinent to Current Situation/Hospitalization:  No - Comment as needed Significant Relationships:  Adult Children Lives with:  Facility Resident Do you feel safe going back to the place where you live?  Yes Need for family participation in patient care:  Yes (Comment)(family involved)  Care giving concerns:  Pt admitted from [facility]. Anticipated returning at DC however deconditioned during stay. Has had fall in facility and admitted for work up of ataxia. 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 Family and pt and discussed with providers and PMT and have decided to pursue no aggressive treatment. Want to see if pt's functioning can improve with rehab.   Social Worker assessment / plan:  CSW consulted to assist with disposition- pt admitted from [facility] and SNF recommended at [place]. Discussed care needs above with pt's daughter. She reports pt has benefitted from SNF in the past and they want to pursue short term rehab with goal of improving functioning prior to proceeding with long term treatment decisions.  Made referrals to area SNFs and will follow up with bed offers (daughter reports preference for [facility] in [place] if available).  Plan: SNF with palliative care following at [place]. Referrals  pending  Employment status:  Retired Forensic scientist:  Medicare PT Recommendations:  [place] / Referral to community resources:  [place]  Patient/Family's Response to care:  appreciative  Patient/Family's Understanding of and Emotional Response to Diagnosis, Current Treatment, and Prognosis:  Pt does not demonstrate understanding, reports he does not know why he is in hospital. Pleasant and agreeable to interactions however. Daughter shows good understanding and verbalized reasonable expectations for post-acute disposition (see above)  Emotional Assessment Appearance:  Appears stated age Attitude/Demeanor/Rapport:  Engaged Affect (typically observed):  Calm Orientation:  Oriented to Self, Oriented to Place Alcohol / Substance use:  Not Applicable Psych involvement (Current and /or in the community):  No (Comment)  Discharge Needs  Concerns to be addressed:  Discharge Planning Concerns Readmission within the last 30 days:  No Current discharge risk:  Chronically ill, Dependent with Mobility Barriers to Discharge:  No Barriers Identified   Nila Nephew, LCSW 03/04/2018, 1:50 PM [phone]

## 2018-03-05 MED ORDER — DEXAMETHASONE 2 MG PO TABS: 2.0000 mg | ORAL_TABLET | Freq: Two times a day (BID) | ORAL | Status: DC

## 2018-03-05 NOTE — Discharge Instructions (Signed)
Metastatic Brain Tumor  A metastatic brain tumor is a growth in your brain. It is made of cancer cells from another part of your body that traveled to your brain through your bloodstream, along the nerves of your brain (cranial nerves), or through the opening at the base of your skull.  What are the causes?  The most common cause of a metastatic brain tumor in men is lung cancer. In women, it is breast cancer. Other common causes include:   Unknown types of cancers.   Skin cancer (melanoma).   Colon cancer.   Kidney cancer.    What are the signs or symptoms?  Most signs and symptoms of metastatic brain tumors are caused by increased pressure inside your brain. A headache is often the first symptom. Eventually, almost everyone with a metastatic brain tumor has a headache. Other signs and symptoms include:   Vomiting.   Weakness or fatigue.   Seizures.   Sensory problems, like tingling or numbness.   Problems walking.   Clumsiness.   Emotional changes.   Personality changes.   Changes in speech or vision.    How is this diagnosed?  Your health care provider can diagnose a metastatic brain tumor from your medical history and physical exam. You may also have some additional tests, including:   A nervous system function test (neurologic exam).   Imaging studies of your brain, such as an MRI and CT scan.   Having a small piece of tissue removed (biopsy) from your brain to be checked under a microscope.    How is this treated?  Treatment depends on the type of cancer you have and your overall health. It also depends on the size of your tumor and the number of brain tumors you have. The most common treatments include:   Medicines to control pain, nausea, and seizures.   Cancer-killing drugs (chemotherapy).   An X-ray treatment called radiation therapy to kill brain tumor cells.   A type of radiation therapy that is targeted to exact locations in the brain (stereotactic radiotherapy).   Surgery to remove  brain tumors.    Follow these instructions at home:   Only take medicines as directed by your health care provider.   Do not drive if:  ? You are taking strong pain medicines.  ? Your vision or thinking is not clear.   Take two 10-minute walks every day if you are able. Exercising is a good way to relieve stress. It can also help you sleep.   Be sure to get enough calories and protein in your diet.  ? If you have a poor appetite or feel nauseous, eat small meals often.  ? Supplement your diet with protein shakes or milk shakes.   It is common to have anxiety and depression when you are coping with cancer.  ? Talk to friends and loved ones about your feelings.  ? Have a good support system at home.  Contact a health care provider if:   You have chills or fever.   Your medicine is not controlling your symptoms.   Your symptoms get worse or you develop new symptoms.   You are having trouble caring for yourself or being cared for at home.   You are struggling with anxiety or depression.  Get help right away if:   You cannot stop vomiting.   You cannot keep down any foods or fluids.   You have sudden changes in speech or vision.   You have   a seizure.   You can no longer take care of yourself at home.  This information is not intended to replace advice given to you by your health care provider. Make sure you discuss any questions you have with your health care provider.  Document Released: 02/27/2004 Document Revised: 11/08/2015 Document Reviewed: 05/24/2013  Elsevier Interactive Patient Education  2017 [employer]

## 2018-03-05 NOTE — Progress Notes (Signed)
Per Dr.Mikhail we can SL patient

## 2018-03-05 NOTE — Clinical Social Work Placement (Signed)
Pt admitting to [facility] room 426- report# [phone]. Arranged PTAR transportation. Daughter at bedside aware- bringing pt belongings to SNF. DC information provided to admissions coordinator.   CLINICAL SOCIAL WORK PLACEMENT  NOTE  Date:  03/05/2018  Patient Details  Name: Brendan Rose MRN: [id number] Date of Birth: 25-Dec-1921  Clinical Social Work is seeking post-discharge placement for this patient at the [place] level of care (*CSW will initial, date and re-position this form in  chart as items are completed):  Yes   Patient/family provided with [place] Work Department's list of facilities offering this level of care within the geographic area requested by the patient (or if unable, by the patient's family).  Yes   Patient/family informed of their freedom to choose among providers that offer the needed level of care, that participate in Medicare, Medicaid or managed care program needed by the patient, have an available bed and are willing to accept the patient.  Yes   Patient/family informed of [facility]'s ownership interest in [facility] and [facility], as well as of the fact that they are under no obligation to receive care at these facilities.  PASRR submitted to EDS on       PASRR number received on       Existing PASRR number confirmed on 03/02/18     FL2 transmitted to all facilities in geographic area requested by pt/family on 03/03/18     FL2 transmitted to all facilities within larger geographic area on       Patient informed that his/her managed care company has contracts with or will negotiate with certain facilities, including the following:        Yes   Patient/family informed of bed offers received.  Patient chooses bed at [facility])     Physician recommends and patient chooses bed at [facility])    Patient to be transferred to [facility]) on 03/05/18.  Patient  to be transferred to facility by PTAR     Patient family notified on 03/05/18 of transfer.  Name of family member notified:  daughter Jackelyn Poling     PHYSICIAN       Additional Comment:    _______________________________________________ Nila Nephew, LCSW 03/05/2018, 11:19 AM [phone]

## 2018-03-05 NOTE — Progress Notes (Signed)
Called report to [facility] at [facility] and has no further questions. Provided my direct number in case they had any further quesions. PTAR scheduled for 1:00pm

## 2018-04-16 DEATH — deceased

## 2019-06-19 IMAGING — CR DG LUMBAR SPINE COMPLETE 4+V
5 series · 5 of 5 positions shown · non-contrast
Comparison: None.

CLINICAL DATA: Initial encounter for Pt is presented from [REDACTED] for evaluation post fall suspected to be related to a near
syncope episode per family report.

EXAM:
LUMBAR SPINE - COMPLETE 4+ VIEW

[t lumbar spine ap]
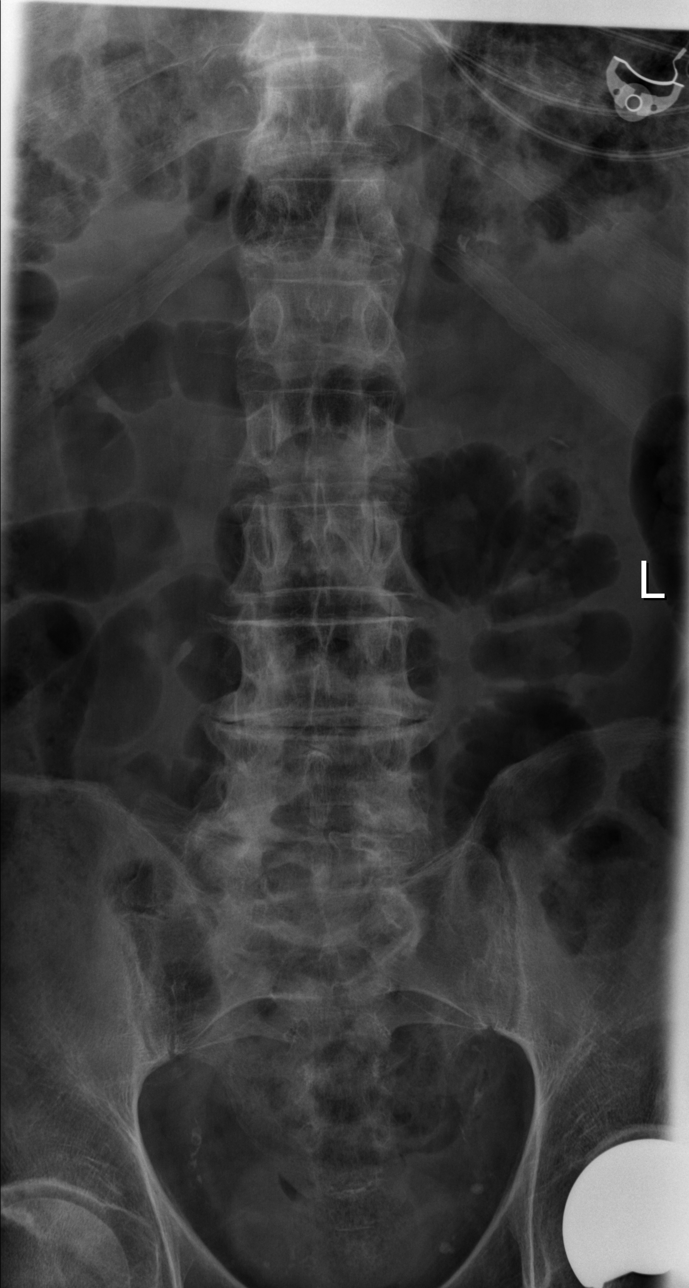

[t lumbar spine obl (1 of 2)]
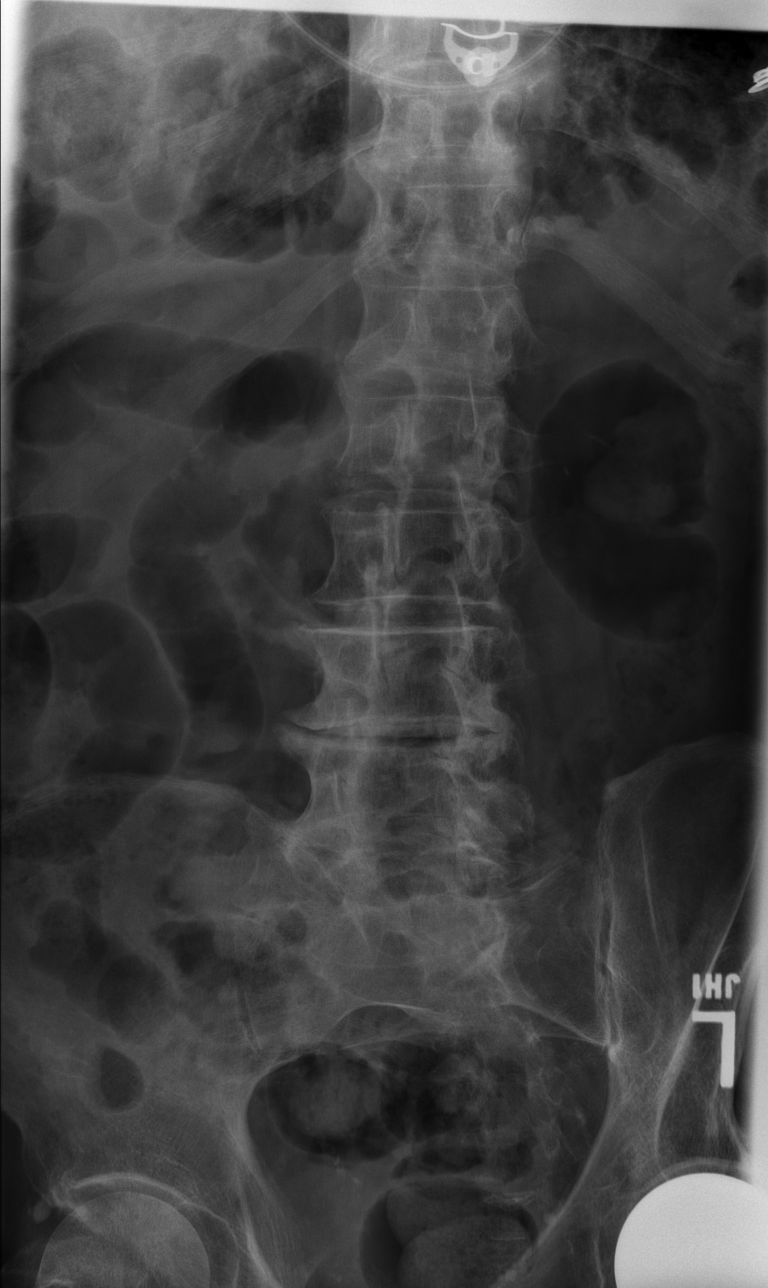

[t lumbar spine obl (2 of 2)]
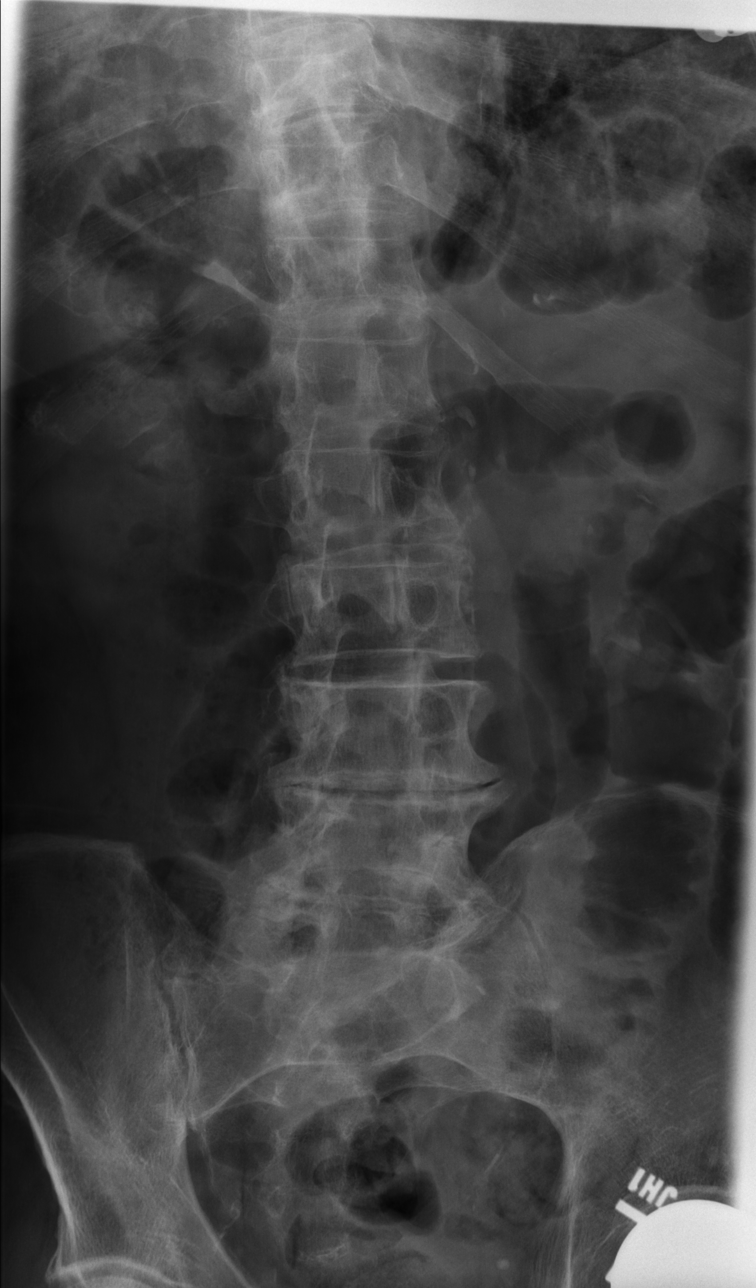

[t lumbar spine lat]
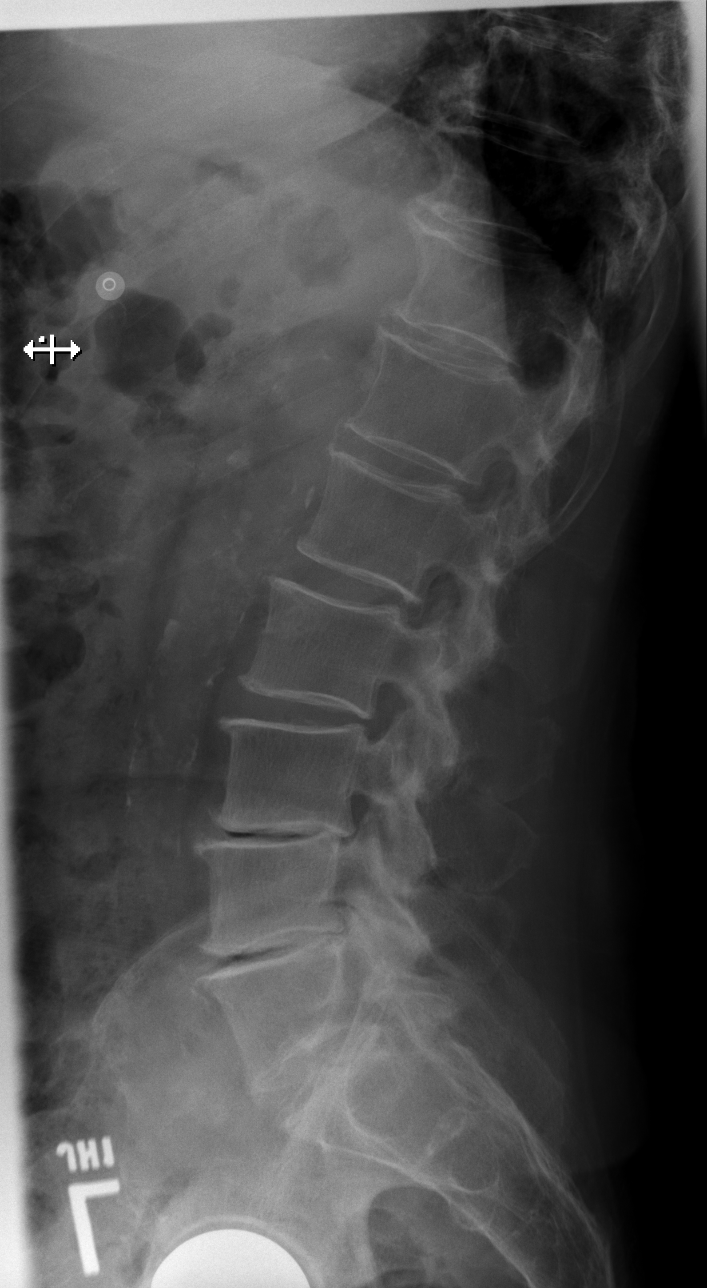

[t lumbar l-5 s-1 spot]
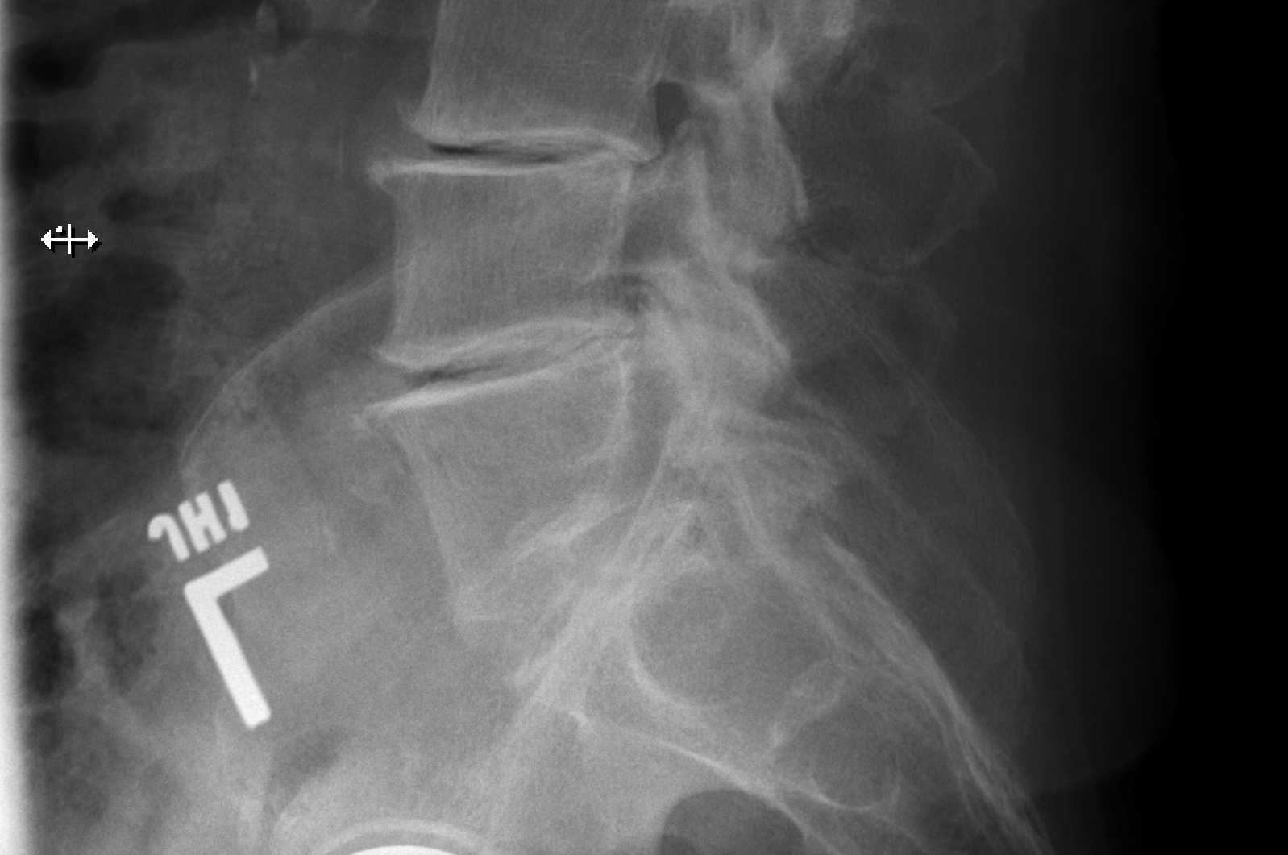

[5 of 5 positions shown; findings below may reference images not displayed]

FINDINGS: Five lumbar type vertebral bodies. Left hip arthroplasty. Sacroiliac
joints are symmetric. Maintenance of vertebral body height and
alignment. Spondylosis involves L3 through S1 with degenerative disc
disease and facet arthropathy. Aortic atherosclerosis.
IMPRESSION: Spondylosis, without acute osseous abnormality.

Aortic Atherosclerosis ([id number]-[id number]).

## 2019-06-19 IMAGING — CT CT HEAD W/O CM
3 series · 14 of 47 positions shown, 16 images · non-contrast
Comparison: 11/19/2016

CLINICAL DATA: Post fall with near syncopal episode.

EXAM:
CT HEAD WITHOUT CONTRAST
TECHNIQUE: Contiguous axial images were obtained from the base of the skull
through the vertex without intravenous contrast.

[Series 2: head wo · axial · 0.47mm/px · z∈[+1546,+1676]mm · 8 of 32 slices shown, 10 images]
[im 3/32  brain]
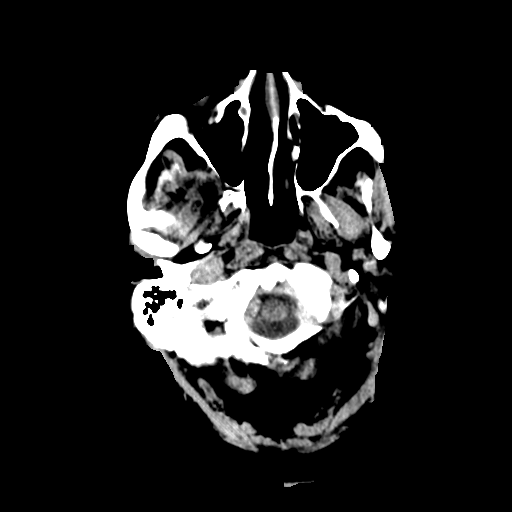
[im 3/32  bone]
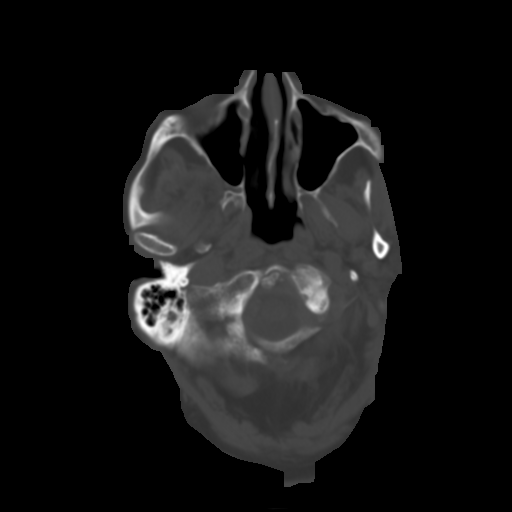
[im 7/32  brain]
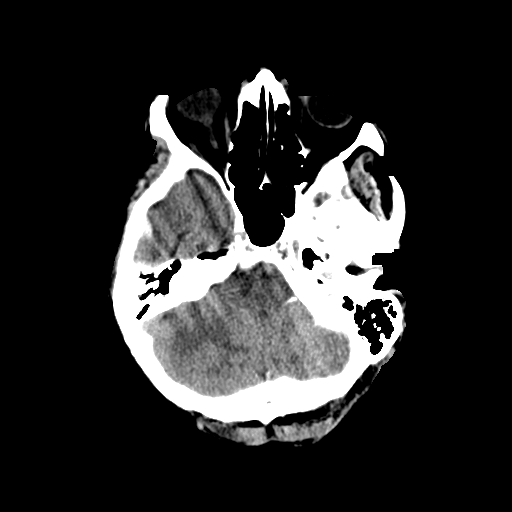
[im 10/32  brain]
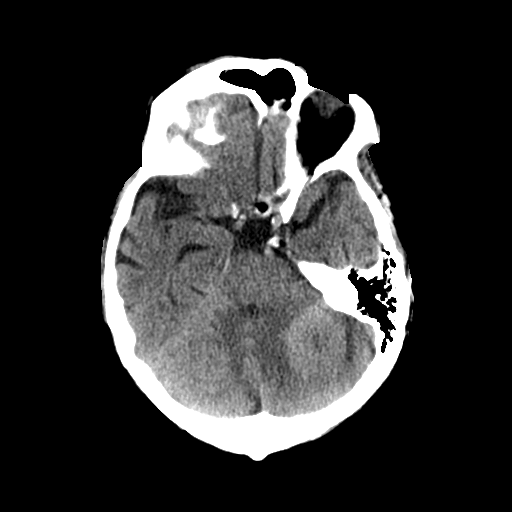
[im 14/32  brain]
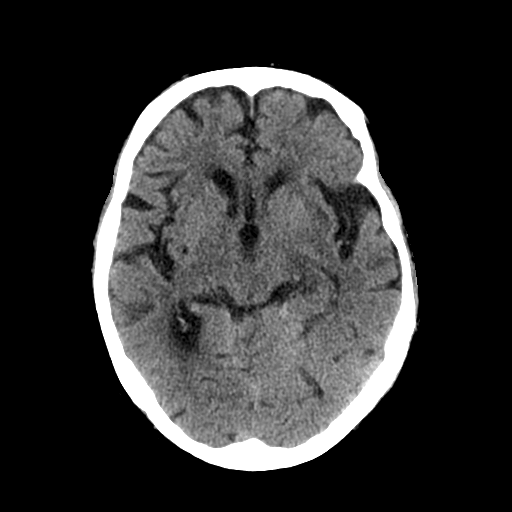
[im 18/32  brain]
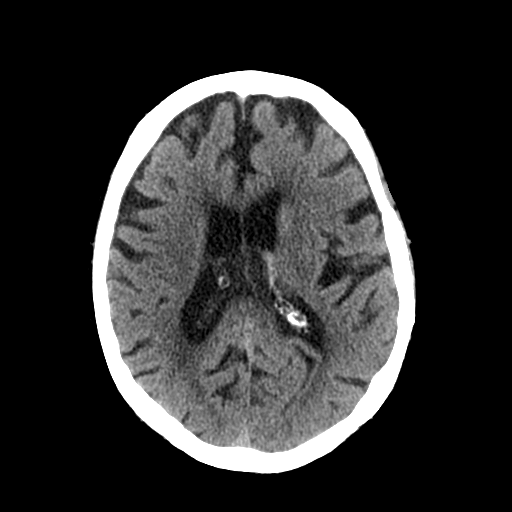
[im 18/32  bone]
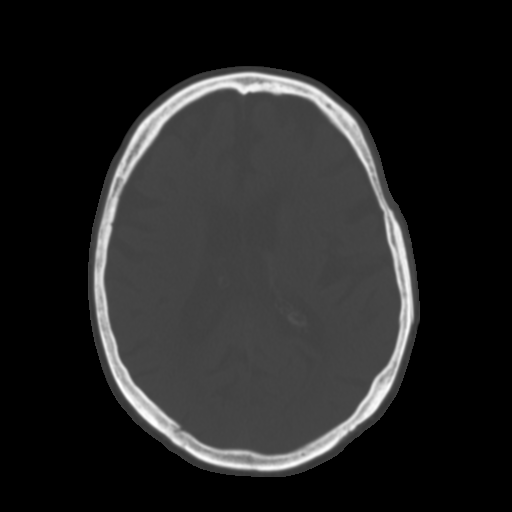
[im 22/32  brain]
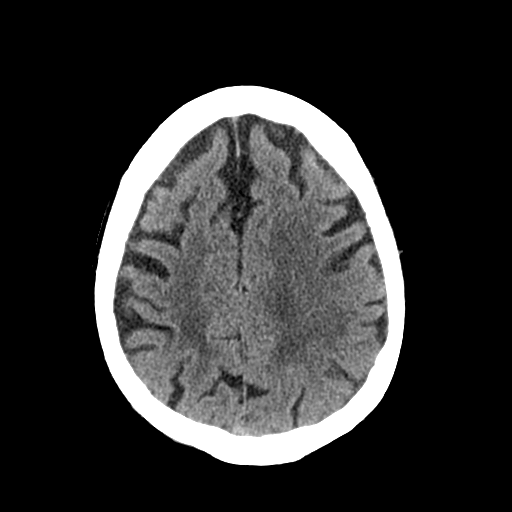
[im 25/32  brain]
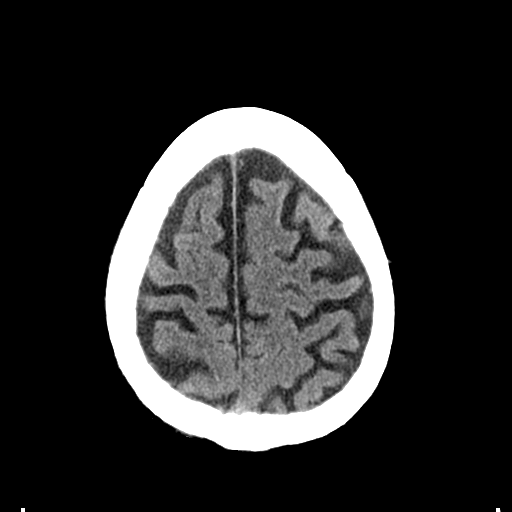
[im 29/32  brain]
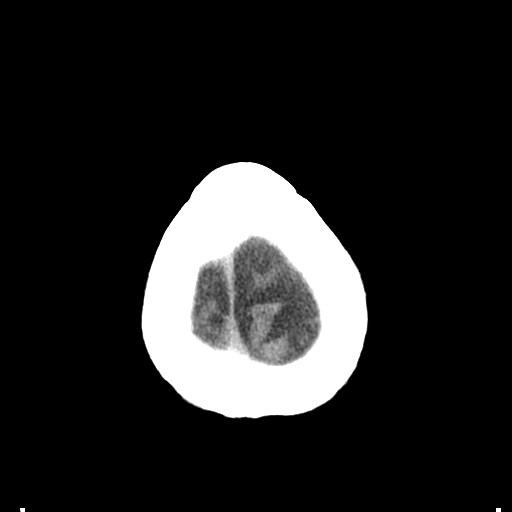

[Series 4: coronal soft tissue · coronal · 0.30mm/px · 3 of 83 slices shown]
[im 28/83  brain]
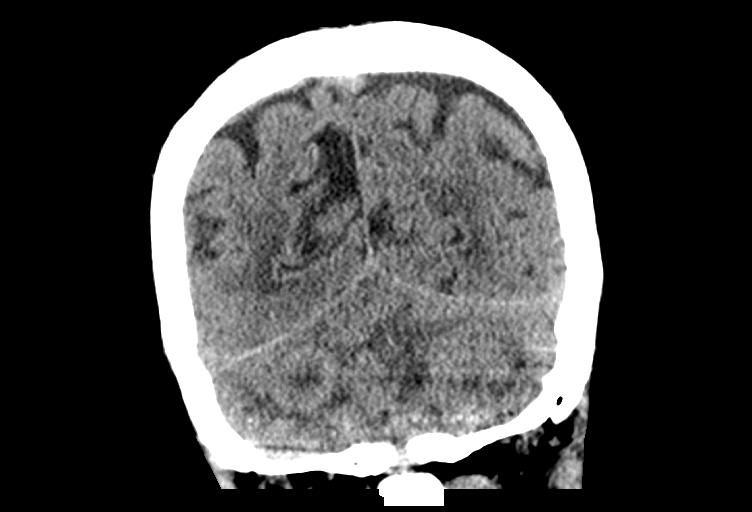
[im 37/83  brain]
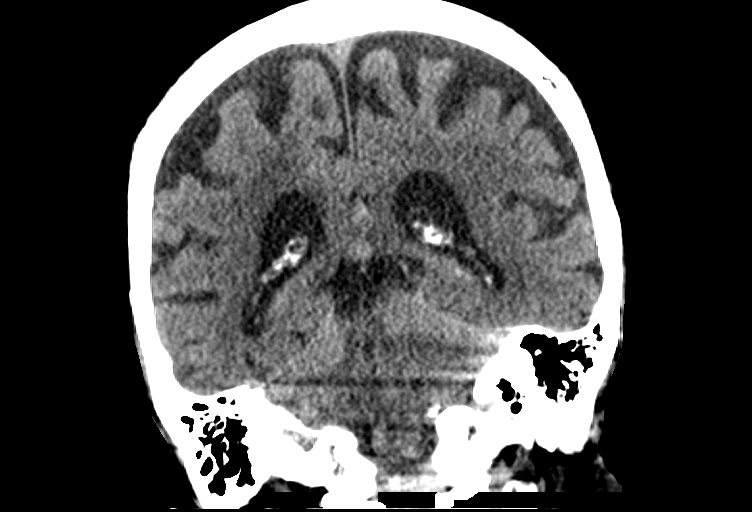
[im 46/83  brain]
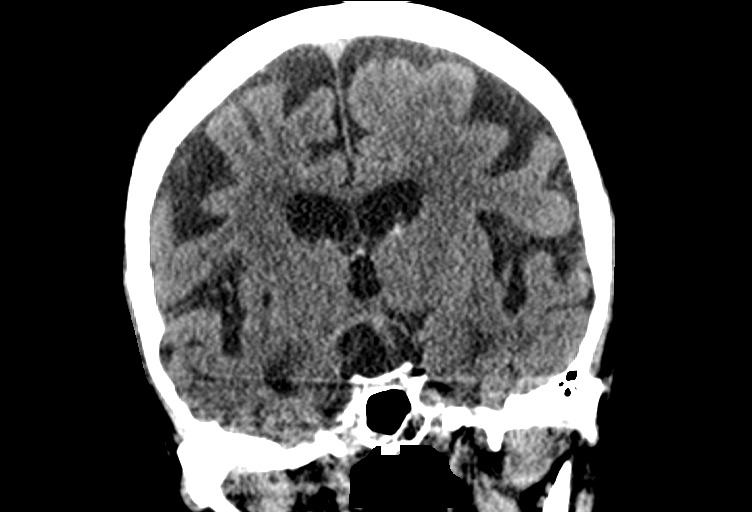

[Series 5: sagittal soft tissue · sagittal · 0.35mm/px · 3 of 73 slices shown]
[im 25/73  brain]
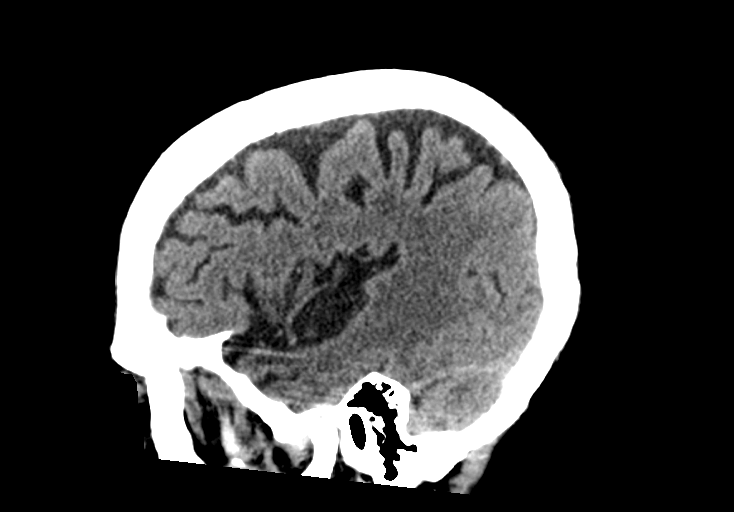
[im 37/73  brain]
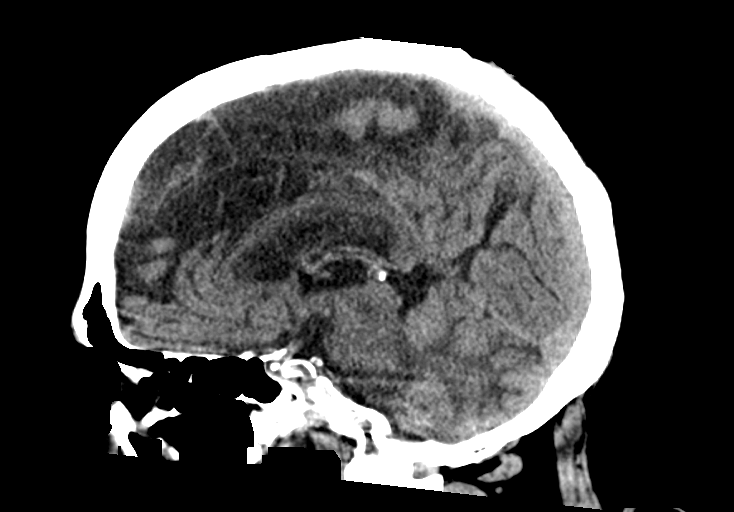
[im 49/73  brain]
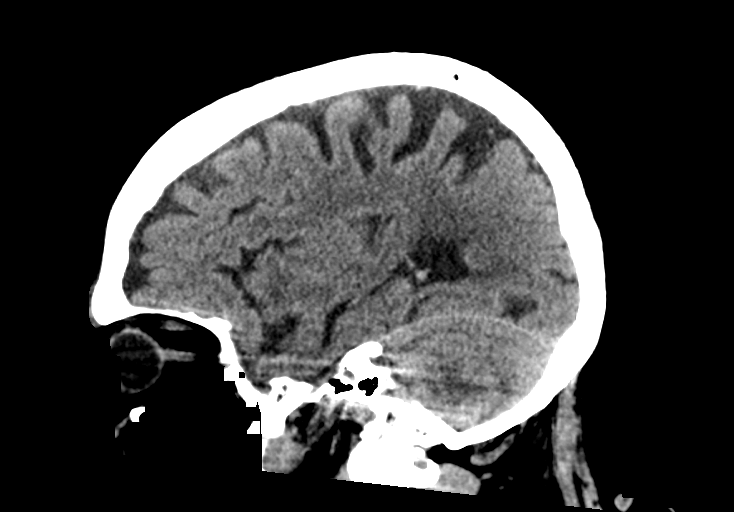

[14 of 47 positions shown; findings below may reference images not displayed]

FINDINGS: Brain: Lateral and third ventricles are within normal. There is mild
impression on the fourth ventricle as there are multiple cerebellar
masses with the largest measuring 4.3 cm over the left hemisphere.
These masses have low-density center and likely due to metastatic
disease. There is mild adjacent vasogenic edema. No definite
supratentorial masses identified. No evidence of acute hemorrhage or
midline shift. No acute infarction. Mild chronic ischemic
microvascular disease.

Vascular: No hyperdense vessel or unexpected calcification.

Skull: Normal. Negative for fracture or focal lesion.

Sinuses/Orbits: No acute finding.

Other: None.
IMPRESSION: No acute findings.

Multiple cerebellar masses with mild adjacent edema and mild mass
effect on the fourth ventricle. Findings likely due to metastatic
disease.

Mild chronic ischemic microvascular disease.
